# Patient Record
Sex: Female | Born: 1980 | Race: White | Hispanic: No | Marital: Single | Smoking: Former smoker
Health system: Southern US, Community
[De-identification: ages and names within clinical notes are randomized; demographics above are authoritative.]

## PROBLEM LIST (undated history)

## (undated) DIAGNOSIS — B9689 Other specified bacterial agents as the cause of diseases classified elsewhere: Secondary | ICD-10-CM

## (undated) DIAGNOSIS — G43109 Migraine with aura, not intractable, without status migrainosus: Secondary | ICD-10-CM

## (undated) DIAGNOSIS — IMO0001 Reserved for inherently not codable concepts without codable children: Secondary | ICD-10-CM

## (undated) DIAGNOSIS — R569 Unspecified convulsions: Secondary | ICD-10-CM

## (undated) DIAGNOSIS — Z23 Encounter for immunization: Secondary | ICD-10-CM

## (undated) DIAGNOSIS — N760 Acute vaginitis: Secondary | ICD-10-CM

## (undated) DIAGNOSIS — A6000 Herpesviral infection of urogenital system, unspecified: Secondary | ICD-10-CM

## (undated) DIAGNOSIS — Z8742 Personal history of other diseases of the female genital tract: Secondary | ICD-10-CM

## (undated) DIAGNOSIS — Z9289 Personal history of other medical treatment: Secondary | ICD-10-CM

## (undated) HISTORY — DX: Other specified bacterial agents as the cause of diseases classified elsewhere: N76.0

## (undated) HISTORY — DX: Reserved for inherently not codable concepts without codable children: IMO0001

## (undated) HISTORY — DX: Other specified bacterial agents as the cause of diseases classified elsewhere: B96.89

## (undated) HISTORY — DX: Herpesviral infection of urogenital system, unspecified: A60.00

## (undated) HISTORY — DX: Personal history of other medical treatment: Z92.89

## (undated) HISTORY — DX: Unspecified convulsions: R56.9

## (undated) HISTORY — DX: Encounter for immunization: Z23

## (undated) HISTORY — DX: Personal history of other diseases of the female genital tract: Z87.42

## (undated) HISTORY — DX: Migraine with aura, not intractable, without status migrainosus: G43.109

---
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
## 2006-07-13 DIAGNOSIS — Z23 Encounter for immunization: Secondary | ICD-10-CM

## 2006-07-13 HISTORY — DX: Encounter for immunization: Z23

## 2008-01-14 ENCOUNTER — Emergency Department: Payer: Self-pay | Admitting: Emergency Medicine

## 2008-01-14 ENCOUNTER — Other Ambulatory Visit: Payer: Self-pay

## 2008-07-13 HISTORY — PX: BREAST ENHANCEMENT SURGERY: SHX7

## 2011-04-24 DIAGNOSIS — Z8742 Personal history of other diseases of the female genital tract: Secondary | ICD-10-CM

## 2011-04-24 HISTORY — DX: Personal history of other diseases of the female genital tract: Z87.42

## 2011-07-18 ENCOUNTER — Emergency Department: Payer: Self-pay | Admitting: Emergency Medicine

## 2011-07-18 LAB — CBC
HGB: 14.3 g/dL (ref 12.0–16.0)
MCH: 30.3 pg (ref 26.0–34.0)
MCHC: 33.2 g/dL (ref 32.0–36.0)
MCV: 91 fL (ref 80–100)
RBC: 4.72 10*6/uL (ref 3.80–5.20)

## 2011-07-18 LAB — URINALYSIS, COMPLETE
Bilirubin,UR: NEGATIVE
Blood: NEGATIVE
Ketone: NEGATIVE
Ph: 8 (ref 4.5–8.0)
Protein: NEGATIVE
RBC,UR: 2 /HPF (ref 0–5)
Squamous Epithelial: <1
WBC UR: 2 /HPF (ref 0–5)

## 2011-07-18 LAB — COMPREHENSIVE METABOLIC PANEL
Albumin: 4.2 g/dL (ref 3.4–5.0)
Alkaline Phosphatase: 50 U/L (ref 50–136)
BUN: 14 mg/dL (ref 7–18)
Bilirubin,Total: 0.9 mg/dL (ref 0.2–1.0)
Co2: 25 mmol/L (ref 21–32)
Creatinine: 0.88 mg/dL (ref 0.60–1.30)
EGFR (African American): >60
Glucose: 113 mg/dL — ABNORMAL HIGH (ref 65–99)
Osmolality: 288 (ref 275–301)
SGPT (ALT): 18 U/L
Sodium: 144 mmol/L (ref 136–145)
Total Protein: 7.7 g/dL (ref 6.4–8.2)

## 2011-07-18 LAB — PREGNANCY, URINE: Pregnancy Test, Urine: NEGATIVE m[IU]/mL

## 2011-07-18 LAB — RAPID INFLUENZA A&B ANTIGENS

## 2011-07-18 LAB — LIPASE, BLOOD: Lipase: 81 U/L (ref 73–393)

## 2013-03-08 ENCOUNTER — Ambulatory Visit: Payer: Self-pay | Admitting: Neurology

## 2013-03-08 LAB — HCG, QUANTITATIVE, PREGNANCY: Beta Hcg, Quant.: <1 m[IU]/mL — ABNORMAL LOW

## 2014-12-28 HISTORY — PX: OTHER SURGICAL HISTORY: SHX169

## 2015-07-04 ENCOUNTER — Other Ambulatory Visit: Payer: Self-pay | Admitting: Neurology

## 2015-07-04 DIAGNOSIS — R55 Syncope and collapse: Secondary | ICD-10-CM

## 2015-07-04 DIAGNOSIS — M542 Cervicalgia: Secondary | ICD-10-CM

## 2015-07-30 ENCOUNTER — Ambulatory Visit: Payer: Self-pay

## 2015-08-26 ENCOUNTER — Ambulatory Visit (HOSPITAL_COMMUNITY)
Admission: RE | Admit: 2015-08-26 | Discharge: 2015-08-26 | Disposition: A | Discharge status: Home / Self Care | Payer: 59 | Source: Ambulatory Visit | Attending: Neurology | Admitting: Neurology

## 2015-08-26 DIAGNOSIS — R569 Unspecified convulsions: Secondary | ICD-10-CM | POA: Insufficient documentation

## 2015-08-26 DIAGNOSIS — M542 Cervicalgia: Secondary | ICD-10-CM

## 2015-08-26 DIAGNOSIS — R51 Headache: Secondary | ICD-10-CM | POA: Insufficient documentation

## 2015-08-26 DIAGNOSIS — R55 Syncope and collapse: Secondary | ICD-10-CM | POA: Insufficient documentation

## 2017-03-01 NOTE — Progress Notes (Signed)
Gynecology Annual Exam  PCP: Lonell Face, MD  Chief Complaint:  Chief Complaint  Patient presents with  . Gynecologic Exam    History of Present Illness:Penny Hamilton presents today for her annual exam. She is a 36 year old Caucasian/White female, G 0 P 0 0 0 0, who has no gyn complaints. Her menses are absent due to the Nexplanon.. She denies dysmenorrhea.   The patient's past medical history is remarkable for seizures and migraine headaches. She reports that her last seizure was 2 years ago and her headaches are usually a few times a month. Both her seizures and headaches have decreased in frequency since her Nexplanon was reinserted 12/28/2014. She also has a history of HSV II and she would like a refill of her Valtrex.  Since her last annual GYN exam dated 12/24/2014, she has had no other significant changes in her health history.   She is not sexually active. She has been sexually active in the past but not currently. She denies any history of STDs.  Her most recent pap smear was obtained 11/24/2013 and was negative cells with negative HPV DNA. History of LGSIL in 2013 Mammogram is not applicable. There is no family history of breast cancer. There is no family history of ovarian cancer. The patient does do occ  self breast exams. Has felt some tenderness in her inner upper right breast in the last couple months and a ?mass.She has breast implants. The patient is smoking a few cigs/day. Pt is unable to take Chantix d/t seizures and nicotine patches give her heart palpitations. Pt has not tried Wellbutrin.  The patient does not drink alcohol.  The patient does not use illegal drugs.  The patient has not ben exercising due to overtime at work. She has a BMI of 23.59 kg/m2.  The patient does get adequate calcium in her diet.  She has had a recent cholesterol screen 2016 and it was normal. Pt does not have a PCP but many specialists.   The patient denies current symptoms of  depression.    Review of Systems: Review of Systems  Constitutional: Negative for chills, fever and weight loss.  HENT: Negative for congestion, sinus pain and sore throat.   Eyes: Negative for blurred vision and pain.  Respiratory: Negative for hemoptysis, shortness of breath and wheezing.   Cardiovascular: Negative for chest pain, palpitations and leg swelling.  Gastrointestinal: Negative for abdominal pain, blood in stool, diarrhea, heartburn, nausea and vomiting.  Genitourinary: Negative for dysuria, frequency, hematuria and urgency.       Positive for amenorrhea  Musculoskeletal: Negative for back pain, joint pain and myalgias.  Skin: Negative for itching and rash.  Neurological: Negative for dizziness, tingling and headaches.  Endo/Heme/Allergies: Negative for environmental allergies and polydipsia. Does not bruise/bleed easily.       Negative for hirsutism   Psychiatric/Behavioral: Negative for depression. The patient is nervous/anxious. The patient does not have insomnia.     Past Medical History:  Past Medical History:  Diagnosis Date  . Genital herpes   . History of abnormal cervical Pap smear 04/24/2011   LGSIL, normal paps 2013/2015  . Human papilloma virus (HPV) type 9 vaccine administered    gardisil completed  . Migraine with aura   . Seizures (HCC)     Past Surgical History:  Past Surgical History:  Procedure Laterality Date  . BREAST ENHANCEMENT SURGERY Bilateral 2010  . nexplanon insertion  12/28/2014    Family  History:  Family History  Problem Relation Age of Onset  . Colon polyps Mother   . Polycystic ovary syndrome Mother   . Melanoma Brother 36       on back  . Stroke Maternal Grandmother   . Heart attack Paternal Grandfather   . Melanoma Paternal Aunt   . Diabetes Neg Hx     Social History:  Social History   Social History  . Marital status: Single    Spouse name: N/A  . Number of children: 0  . Years of education: 23   Occupational  History  . 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 Smoking status: Current Every Day Smoker    Types: Cigarettes  . Smokeless tobacco: Never Used     Comment: 1 pack per week  . Alcohol use No  . Drug use: No  . Sexual activity: Not Currently    Birth control/ protection: Implant   Other Topics Concern  . Not on file   Social History Narrative  . No narrative on file    Allergies:  Allergies  Allergen Reactions  . Topiramate Nausea Only  . Lamotrigine Rash    Medications: Current Outpatient Prescriptions:  .  aspirin-acetaminophen-caffeine (EXCEDRIN MIGRAINE) 250-250-65 MG tablet, Take by mouth., Disp: , Rfl:  .  Cholecalciferol (VITAMIN D-1000 MAX ST) 1000 units tablet, Take by mouth., Disp: , Rfl:  .  etonogestrel (NEXPLANON) 68 MG IMPL implant, 1 each by Subdermal route once., Disp: , Rfl:  .  valACYclovir (VALTREX) 500 MG tablet, Take one BID x 3-5 days prn outbreak, Disp: 30 tablet, Rfl: 2 Physical Exam Vitals: BP 102/62   Pulse 79   Ht 5\' 2"  (1.575 m)   Wt 129 lb (58.5 kg)   BMI 23.59 kg/m   General: WF in NAD HEENT: normocephalic, anicteric Neck: no thyroid enlargement, no palpable nodules, no cervical lymphadenopathy  Pulmonary: No increased work of breathing, CTAB Cardiovascular: RRR, without murmur  Breast: Breast implants present,  no tenderness, no palpable nodules or masses, no skin or nipple retraction present, no nipple discharge.  No axillary, infraclavicular or supraclavicular lymphadenopathy. Abdomen: Soft, non-tender, non-distended.  Umbilicus without lesions.  No hepatomegaly or masses palpable. No evidence of hernia. Genitourinary:  External: vesicular lesions on inflamed base on left lower labia majora.  Normal urethral meatus, normal  Bartholin's and Skene's glands.    Vagina: Normal vaginal mucosa, no evidence of prolapse.    Cervix: Grossly normal in appearance, no bleeding, non-tender  Uterus: Anteverted, normal size, shape,  and consistency, mobile, and non-tender  Adnexa: No adnexal masses, non-tender  Rectal: deferred  Lymphatic: no evidence of inguinal lymphadenopathy Extremities: no edema, erythema, or tenderness Neurologic: Grossly intact Psychiatric: mood appropriate, affect full     Assessment: 36 y.o. G0P0000 for annual gyn exm Tobacco use Herpes genitalis  Plan:  1) Breast cancer screening - recommend monthly self breast exam.   2) STI screening was offered and declined.  3) Cervical cancer screening - Pap was done. ASCCP guidelines and rational discussed.  Patient opts for yearly screening interval  4) Contraception - Nexplanon will expire June 2019. Will probably have it replaced  5) Routine healthcare maintenance including cholesterol and diabetes screening UTD   6) Discussed smoking cessation and given smoking cessation hot line #.   7) Refill of Valtrex sent to pharmacy  Farrel Conners, CNM

## 2017-03-02 ENCOUNTER — Ambulatory Visit (INDEPENDENT_AMBULATORY_CARE_PROVIDER_SITE_OTHER): Payer: 59 | Admitting: Certified Nurse Midwife

## 2017-03-02 ENCOUNTER — Encounter: Payer: Self-pay | Admitting: Certified Nurse Midwife

## 2017-03-02 VITALS — BP 102/62 | HR 79 | Ht 62.0 in | Wt 129.0 lb

## 2017-03-02 DIAGNOSIS — Z01419 Encounter for gynecological examination (general) (routine) without abnormal findings: Secondary | ICD-10-CM | POA: Diagnosis not present

## 2017-03-02 DIAGNOSIS — R569 Unspecified convulsions: Secondary | ICD-10-CM | POA: Insufficient documentation

## 2017-03-02 DIAGNOSIS — A6000 Herpesviral infection of urogenital system, unspecified: Secondary | ICD-10-CM | POA: Insufficient documentation

## 2017-03-02 DIAGNOSIS — Z124 Encounter for screening for malignant neoplasm of cervix: Secondary | ICD-10-CM

## 2017-03-02 DIAGNOSIS — G43109 Migraine with aura, not intractable, without status migrainosus: Secondary | ICD-10-CM | POA: Insufficient documentation

## 2017-03-02 MED ORDER — VALACYCLOVIR HCL 500 MG PO TABS: ORAL_TABLET | ORAL | 2 refills | Status: DC

## 2017-03-02 NOTE — Patient Instructions (Signed)
Steps to Quit Smoking Smoking tobacco can be bad for your health. It can also affect almost every organ in your body. Smoking puts you and people around you at risk for many serious long-lasting (chronic) diseases. Quitting smoking is hard, but it is one of the best things that you can do for your health. It is never too late to quit. What are the benefits of quitting smoking? When you quit smoking, you lower your risk for getting serious diseases and conditions. They can include:  Lung cancer or lung disease.  Heart disease.  Stroke.  Heart attack.  Not being able to have children (infertility).  Weak bones (osteoporosis) and broken bones (fractures).  If you have coughing, wheezing, and shortness of breath, those symptoms may get better when you quit. You may also get sick less often. If you are pregnant, quitting smoking can help to lower your chances of having a baby of low birth weight. What can I do to help me quit smoking? Talk with your doctor about what can help you quit smoking. Some things you can do (strategies) include:  Quitting smoking totally, instead of slowly cutting back how much you smoke over a period of time.  Going to in-person counseling. You are more likely to quit if you go to many counseling sessions.  Using resources and support systems, such as: ? Online chats with a counselor. ? Phone quitlines. ? Printed self-help materials. ? Support groups or group counseling. ? Text messaging programs. ? Mobile phone apps or applications.  Taking medicines. Some of these medicines may have nicotine in them. If you are pregnant or breastfeeding, do not take any medicines to quit smoking unless your doctor says it is okay. Talk with your doctor about counseling or other things that can help you.  Talk with your doctor about using more than one strategy at the same time, such as taking medicines while you are also going to in-person counseling. This can help make  quitting easier. What things can I do to make it easier to quit? Quitting smoking might feel very hard at first, but there is a lot that you can do to make it easier. Take these steps:  Talk to your family and friends. Ask them to support and encourage you.  Call phone quitlines, reach out to support groups, or work with a counselor.  Ask people who smoke to not smoke around you.  Avoid places that make you want (trigger) to smoke, such as: ? Bars. ? Parties. ? Smoke-break areas at work.  Spend time with people who do not smoke.  Lower the stress in your life. Stress can make you want to smoke. Try these things to help your stress: ? Getting regular exercise. ? Deep-breathing exercises. ? Yoga. ? Meditating. ? Doing a body scan. To do this, close your eyes, focus on one area of your body at a time from head to toe, and notice which parts of your body are tense. Try to relax the muscles in those areas.  Download or buy apps on your mobile phone or tablet that can help you stick to your quit plan. There are many free apps, such as QuitGuide from the CDC (Centers for Disease Control and Prevention). You can find more support from smokefree.gov and other websites.  This information is not intended to replace advice given to you by your health care provider. Make sure you discuss any questions you have with your health care provider. Document Released: 04/25/2009 Document   Revised: 02/25/2016 Document Reviewed: 11/13/2014 Elsevier Interactive Patient Education  [employer]. Quit line 1 681 017 1133 or 1-800-Quit now

## 2017-03-04 LAB — IGP, APTIMA HPV
HPV APTIMA: NEGATIVE
PAP SMEAR COMMENT: 0

## 2017-03-10 ENCOUNTER — Encounter: Payer: Self-pay | Admitting: Certified Nurse Midwife

## 2017-03-10 DIAGNOSIS — A6000 Herpesviral infection of urogenital system, unspecified: Secondary | ICD-10-CM | POA: Insufficient documentation

## 2018-01-03 ENCOUNTER — Telehealth: Payer: Self-pay | Admitting: Obstetrics and Gynecology

## 2018-01-03 NOTE — Telephone Encounter (Signed)
Patient is schedule for nexplanon removal and reinsertion with ABC Wednesday 01/05/18 at 9:50 . Annual schedule with CLG on 03/07/18

## 2018-01-04 NOTE — Telephone Encounter (Signed)
Nexplanon reserved for this patient. 

## 2018-01-05 ENCOUNTER — Ambulatory Visit (INDEPENDENT_AMBULATORY_CARE_PROVIDER_SITE_OTHER): Payer: Managed Care, Other (non HMO) | Admitting: Obstetrics and Gynecology

## 2018-01-05 ENCOUNTER — Encounter: Payer: Self-pay | Admitting: Obstetrics and Gynecology

## 2018-01-05 VITALS — BP 100/60 | Ht 62.0 in | Wt 130.0 lb

## 2018-01-05 DIAGNOSIS — Z3049 Encounter for surveillance of other contraceptives: Secondary | ICD-10-CM

## 2018-01-05 DIAGNOSIS — Z3046 Encounter for surveillance of implantable subdermal contraceptive: Secondary | ICD-10-CM

## 2018-01-05 DIAGNOSIS — A6004 Herpesviral vulvovaginitis: Secondary | ICD-10-CM

## 2018-01-05 DIAGNOSIS — Z30017 Encounter for initial prescription of implantable subdermal contraceptive: Secondary | ICD-10-CM

## 2018-01-05 MED ORDER — VALACYCLOVIR HCL 500 MG PO TABS: ORAL_TABLET | ORAL | 0 refills | Status: DC

## 2018-01-05 MED ORDER — ETONOGESTREL 68 MG ~~LOC~~ IMPL: 1.0000 | DRUG_IMPLANT | Freq: Once | SUBCUTANEOUS | 0 refills | Status: DC

## 2018-01-05 NOTE — Progress Notes (Signed)
   Chief Complaint  Patient presents with  . Contraception     History of Present Illness:  Penny Hamilton is a 37 y.o. that had a nexplanon placed approximately 3 years  ago. Since that time, she has done well and would like a replacement. Is not current sex active. Annual due 8/19.  Nexplanon removal Procedure note - The Nexplanon was noted in the patient's arm and the end was identified. The skin was cleansed with a Betadine solution. A small injection of subcutaneous lidocaine with epinephrine was given over the end of the implant. An incision was made at the end of the implant. The rod was noted in the incision and grasped with a hemostat. It was noted to be intact.  Steri-Strip was placed approximating the incision. Hemostasis was noted.  Nexplanon Insertion  Patient given informed consent, signed copy in the chart, time out was performed.  Appropriate time out taken.  [name] LEFT arm was prepped and draped in the usual sterile fashion. The ruler used to measure and mark insertion area.  Pt was prepped with betadine swab and then injected with 1.0 cc of 2% lidocaine with epinephrine. Nexplanon removed form packaging,  Device confirmed in needle, then inserted full length of needle and withdrawn per handbook instructions.  Pt insertion site covered with steri-strip and a bandage.   Minimal blood loss.  Pt tolerated the procedure welL.  Assessment: Nexplanon removal  Nexplanon insertion - Plan: etonogestrel (NEXPLANON) 68 MG IMPL implant  Herpes simplex vulvovaginitis - Rx RF valtrex prn sx.   Meds ordered this encounter  Medications  . valACYclovir (VALTREX) 500 MG tablet    Sig: Take one BID x 3-5 days prn outbreak    Dispense:  30 tablet    Refill:  0    Order Specific Question:   Supervising Provider    Answer:   Penny MustardHARRIS, Penny P [id number]]  . etonogestrel (NEXPLANON) 68 MG IMPL implant    Sig: 1 each (68 mg total) by Subdermal route once for 1 dose.    Dispense:  1 each    Refill:  0    Order Specific Question:   Supervising Provider    Answer:   Penny MustardHARRIS, Penny P [[id number]]     Plan:   She was told to remove the dressing in 24 hours, to keep the incision area dry for 24 hours and to remove the Steristrip in 2-3  days.  Notify us if any signs of tenderness, redness, pain, or fevers develop.   Penny Hamilton B. Penny Hakeem, PA-C 01/05/2018 10:51 AM

## 2018-01-05 NOTE — Patient Instructions (Signed)
I value your feedback and entrusting us with your care. If you get a [facility] patient survey, I would appreciate you taking the time to let us know about your experience today. Thank you!  Remove the dressing in 24 hours,  keep the incision area dry for 24 hours and remove the Steristrip in 2-3  days.  Notify us if any signs of tenderness, redness, pain, or fevers develop.  

## 2018-02-01 NOTE — Telephone Encounter (Signed)
Nexplanon rcvd/charged 01/05/18

## 2018-03-06 NOTE — Progress Notes (Signed)
Gynecology Annual Exam  PCP: Patient, No Pcp Per  Chief Complaint:  Chief Complaint  Patient presents with  . Gynecologic Exam    No complaints    History of Present Illness:Penny Hamilton presents today for her annual exam. She is a 37 year old Caucasian/White female, G 0 P 0 0 0 0, who has no gyn complaints. Her menses are absent due to the Nexplanon.. She denies dysmenorrhea.   The patient's past medical history is remarkable for seizures and migraine headaches. She reports that her last seizure was 3 years ago and her headaches are usually 4-5 times a month. Both her seizures and headaches have decreased in frequency since she had a Nexplanon inserted in 2016. She also has a history of HSV II and she would like a refill of her Valtrex. She has had 2 outbreaks this past year.  Since her last annual GYN exam dated 03/02/2017, she has quit smoking!  She also had her Nexplanon replaced 01/05/2018. She is not sexually active. She has been sexually active in the past but not currently.   Her most recent pap smear was obtained 03/02/2017 and was negative cells with negative HPV DNA. History of LGSIL in 2013 Mammogram is not applicable. There is no family history of breast cancer. There is no family history of ovarian cancer. The patient does does not do self breast exams. She has breast implants. The patient is a former smoker  The patient does not drink alcohol.  The patient does not use illegal drugs.  The patient has not been exercising 4x/week until this last month when she had a shift change.. She has a BMI of 23.78 kg/m2.  The patient does get adequate calcium in her diet.  She has had a recent cholesterol screen 2019 at work and it was normal.    The patient denies current symptoms of depression.    Review of Systems: Review of Systems  Constitutional: Negative for chills, fever and weight loss.  HENT: Positive for sore throat (wakes with some burning in her throat). Negative for  congestion and sinus pain.   Eyes: Negative for blurred vision and pain.  Respiratory: Negative for hemoptysis, shortness of breath and wheezing.   Cardiovascular: Negative for chest pain, palpitations and leg swelling.  Gastrointestinal: Negative for abdominal pain, blood in stool, diarrhea, heartburn, nausea and vomiting.  Genitourinary: Negative for dysuria, frequency, hematuria and urgency.       Positive for amenorrhea  Musculoskeletal: Negative for back pain, joint pain and myalgias.  Skin: Negative for itching and rash.  Neurological: Positive for headaches. Negative for dizziness and tingling.  Endo/Heme/Allergies: Negative for environmental allergies and polydipsia. Does not bruise/bleed easily.       Negative for hirsutism   Psychiatric/Behavioral: Negative for depression. The patient is not nervous/anxious and does not have insomnia.     Past Medical History:  Past Medical History:  Diagnosis Date  . Bacterial vaginosis    recurrent; tx c metroget prn c good relief  . Genital herpes   . History of abnormal cervical Pap smear 04/24/2011   LGSIL, normal paps since 2013/2015  . History of Papanicolaou smear of cervix 11/23/11; 06/22/12;11/24/13   -/-, ct/gc neg; neg; -/-, ct/gc neg  . Human papilloma virus (HPV) type 9 vaccine administered    gardisil completed  . Immunization, viral disease 2008   gardasil series completed  . Migraine with aura   . Seizures (HCC)     Past  Surgical History:  Past Surgical History:  Procedure Laterality Date  . BREAST ENHANCEMENT SURGERY Bilateral 2010  . nexplanon insertion  12/28/2014    Family History:  Family History  Problem Relation Age of Onset  . Colon polyps Mother   . Polycystic ovary syndrome Mother   . Melanoma Brother 36       on back  . Stroke Maternal Grandmother   . Heart attack Paternal Grandfather   . Melanoma Paternal Aunt   . Diabetes Neg Hx     Social History:  Social History   Socioeconomic History    . Marital status: Single    Spouse name: Not on file  . Number of children: 0  . Years of education: 88  . Highest education level: Not on file  Occupational History  . Occupation: Designer, industrial/product in [employer]  Social Needs  . Financial resource strain: Not on file  . Food insecurity:    Worry: Not on file    Inability: Not on file  . Transportation needs:    Medical: Not on file    Non-medical: Not on file  Tobacco Use  . Smoking status: Former Smoker    Types: Cigarettes    Last attempt to quit: 04/05/2017    Years since quitting: 0.9  . Smokeless tobacco: Never Used  . Tobacco comment: 1 pack per week  Substance and Sexual Activity  . Alcohol use: Yes    Comment: rare  . Drug use: No  . Sexual activity: Not Currently    Partners: Male    Birth control/protection: Implant  Lifestyle  . Physical activity:    Days per week: 4 days    Minutes per session: Not on file  . Stress: Not on file  Relationships  . Social connections:    Talks on phone: Not on file    Gets together: Not on file    Attends religious service: Not on file    Active member of club or organization: Not on file    Attends meetings of clubs or organizations: Not on file    Relationship status: Not on file  . Intimate partner violence:    Fear of current or ex partner: Not on file    Emotionally abused: Not on file    Physically abused: Not on file    Forced sexual activity: Not on file  Other Topics Concern  . Not on file  Social History Narrative  . Not on file    Allergies:  Allergies  Allergen Reactions  . Topiramate Nausea Only  . Lamotrigine Rash    Medications: Current Outpatient Medications:  .  aspirin-acetaminophen-caffeine (EXCEDRIN MIGRAINE) 250-250-65 MG tablet, Take by mouth., Disp: , Rfl:  .  Cholecalciferol (VITAMIN D-1000 MAX ST) 1000 units tablet, Take by mouth., Disp: , Rfl:  .  valACYclovir (VALTREX) 500 MG tablet, Take one BID x 3-5 days prn outbreak, Disp: 30 tablet, Rfl:  1 .  etonogestrel (NEXPLANON) 68 MG IMPL implant, 1 each (68 mg total) by Subdermal route once for 1 dose., Disp: 1 each, Rfl: 0 Physical Exam Vitals: BP 104/60 (BP Location: Right Arm, Patient Position: Sitting, Cuff Size: Normal)   Pulse 85   Wt 130 lb (59 kg)   BMI 23.78 kg/m   General: WF in NAD HEENT: normocephalic, anicteric Neck: no thyroid enlargement, no palpable nodules, no cervical lymphadenopathy  Pulmonary: No increased work of breathing, CTAB Cardiovascular: RRR, without murmur  Breast: Breast implants present,  no tenderness,  no palpable nodules or masses, no skin or nipple retraction present, no nipple discharge.  No axillary, infraclavicular or supraclavicular lymphadenopathy. Abdomen: Soft, non-tender, non-distended.  Umbilicus without lesions.  No hepatomegaly or masses palpable. No evidence of hernia. Genitourinary:  External: No lesions or inflammation.  Normal urethral meatus, normal Bartholin's and Skene's glands.    Vagina: Normal vaginal mucosa, no evidence of prolapse.    Cervix: Grossly normal in appearance, no bleeding, non-tender  Uterus: Anteverted, normal size, shape, and consistency, mobile, and non-tender  Adnexa: No adnexal masses, non-tender  Rectal: deferred  Lymphatic: no evidence of inguinal lymphadenopathy Extremities: no edema, erythema, or tenderness Neurologic: Grossly intact Psychiatric: mood appropriate, affect full     Assessment: 37 y.o. G0P0000 for annual gyn exm History of herpes genitalis Amenorrhea on Nexplanon.  Plan:  1) Breast cancer screening - recommend monthly self breast exam.   2) STI screening was offered and declined.  3) Cervical cancer screening - Pap was done. ASCCP guidelines and rational discussed.  Patient opts for yearly screening interval  4) Contraception - Nexplanon replaced this year-expires in 12/2020  5) Routine healthcare maintenance including cholesterol and diabetes screening UTD   6) Refill of  Valtrex sent to pharmacy  7) RTO 1 year and prn.  Farrel Conners, CNM

## 2018-03-07 ENCOUNTER — Other Ambulatory Visit: Payer: Self-pay

## 2018-03-07 ENCOUNTER — Encounter: Payer: Self-pay | Admitting: Certified Nurse Midwife

## 2018-03-07 ENCOUNTER — Ambulatory Visit (INDEPENDENT_AMBULATORY_CARE_PROVIDER_SITE_OTHER): Payer: Managed Care, Other (non HMO) | Admitting: Certified Nurse Midwife

## 2018-03-07 VITALS — BP 104/60 | HR 85 | Ht 62.0 in | Wt 130.0 lb

## 2018-03-07 DIAGNOSIS — Z124 Encounter for screening for malignant neoplasm of cervix: Secondary | ICD-10-CM

## 2018-03-07 DIAGNOSIS — Z01419 Encounter for gynecological examination (general) (routine) without abnormal findings: Secondary | ICD-10-CM | POA: Diagnosis not present

## 2018-03-07 MED ORDER — VALACYCLOVIR HCL 500 MG PO TABS: ORAL_TABLET | ORAL | 1 refills | Status: DC

## 2018-03-10 LAB — IGP,RFX APTIMA HPV ALL PTH: PAP SMEAR COMMENT: 0

## 2018-03-11 ENCOUNTER — Encounter: Payer: Self-pay | Admitting: Certified Nurse Midwife

## 2019-04-26 ENCOUNTER — Other Ambulatory Visit: Payer: Self-pay | Admitting: Certified Nurse Midwife

## 2019-12-18 ENCOUNTER — Other Ambulatory Visit: Payer: Self-pay | Admitting: Certified Nurse Midwife

## 2020-01-24 ENCOUNTER — Ambulatory Visit: Payer: Managed Care, Other (non HMO) | Admitting: Podiatry

## 2020-01-24 ENCOUNTER — Other Ambulatory Visit: Payer: Self-pay

## 2020-01-24 ENCOUNTER — Encounter: Payer: Self-pay | Admitting: Podiatry

## 2020-01-24 ENCOUNTER — Ambulatory Visit (INDEPENDENT_AMBULATORY_CARE_PROVIDER_SITE_OTHER): Payer: Managed Care, Other (non HMO)

## 2020-01-24 DIAGNOSIS — M79672 Pain in left foot: Secondary | ICD-10-CM

## 2020-01-24 DIAGNOSIS — M722 Plantar fascial fibromatosis: Secondary | ICD-10-CM | POA: Diagnosis not present

## 2020-01-24 DIAGNOSIS — M79671 Pain in right foot: Secondary | ICD-10-CM

## 2020-01-24 DIAGNOSIS — M2580 Other specified joint disorders, unspecified joint: Secondary | ICD-10-CM

## 2020-01-24 DIAGNOSIS — M659 Synovitis and tenosynovitis, unspecified: Secondary | ICD-10-CM | POA: Diagnosis not present

## 2020-01-24 MED ORDER — MELOXICAM 15 MG PO TABS: 15.0000 mg | ORAL_TABLET | Freq: Every day | ORAL | 1 refills | Status: DC

## 2020-01-24 MED ORDER — METHYLPREDNISOLONE 4 MG PO TBPK: ORAL_TABLET | ORAL | 0 refills | Status: DC

## 2020-01-26 NOTE — Progress Notes (Signed)
   HPI: 39 y.o. female presenting today as a new patient for evaluation of left foot and ankle pain.  Patient also has pain to the right forefoot.  Patient has been dealing with this pain off and on for the past 1-2 years.  Sudden onset.  He denies injury to the area.  He states that he experiences dull aches throughout the arch and heel which can occasionally become very sharp and intense.  Aggravated with standing and walking.  He has tried OTC insoles with minimal relief.  He presents for further treatment and evaluation  Past Medical History:  Diagnosis Date  . Bacterial vaginosis    recurrent; tx c metroget prn c good relief  . Genital herpes   . History of abnormal cervical Pap smear 04/24/2011   LGSIL, normal paps since 2013/2015  . History of Papanicolaou smear of cervix 11/23/11; 06/22/12;11/24/13   -/-, ct/gc neg; neg; -/-, ct/gc neg  . Human papilloma virus (HPV) type 9 vaccine administered    gardisil completed  . Immunization, viral disease 2008   gardasil series completed  . Migraine with aura   . Seizures [facility])      Physical Exam: General: The patient is alert and oriented x3 in no acute distress.  Dermatology: Skin is warm, dry and supple bilateral lower extremities. Negative for open lesions or macerations.  Vascular: Palpable pedal pulses bilaterally. No edema or erythema noted. Capillary refill within normal limits.  Neurological: Epicritic and protective threshold grossly intact bilaterally.   Musculoskeletal Exam: Range of motion within normal limits to all pedal and ankle joints bilateral. Muscle strength 5/5 in all groups bilateral.  Pain on palpation noted to the sesamoidal apparatus of the bilateral great toes first MTPJ.  There is also pain on palpation to the medial calcaneal tubercle at the insertion of the plantar fascia medial band the left.  There is also pain on palpation and range of motion to the left ankle joint both anterior medial and lateral  aspects.  Radiographic Exam:  Normal osseous mineralization. Joint spaces preserved. No fracture/dislocation/boney destruction.    Assessment: 1.  Sesamoiditis bilateral 2.  Plantar fasciitis left 3.  Synovitis of left ankle   Plan of Care:  1. Patient evaluated. X-Rays reviewed.  2.  Prescription for Medrol Dosepak 3.  Prescription for meloxicam 15 mg daily to begin after completion of the Dosepak 4.  Appointment with Pedorthist for custom molded insoles 5.  Recommend good supportive shoes at [employer] running store 6.  Return to clinic in 8 weeks  *Works at [employer], DPM [facility]  Dr. Felecia Shelling, DPM    [address] [place], [place] [address]                Office [phone]  Fax [phone]

## 2020-02-05 ENCOUNTER — Other Ambulatory Visit: Payer: Self-pay

## 2020-02-05 ENCOUNTER — Ambulatory Visit (INDEPENDENT_AMBULATORY_CARE_PROVIDER_SITE_OTHER): Payer: Managed Care, Other (non HMO) | Admitting: Orthotics

## 2020-02-05 DIAGNOSIS — M722 Plantar fascial fibromatosis: Secondary | ICD-10-CM

## 2020-02-05 DIAGNOSIS — M2580 Other specified joint disorders, unspecified joint: Secondary | ICD-10-CM

## 2020-02-05 NOTE — Progress Notes (Signed)

## 2020-02-27 ENCOUNTER — Ambulatory Visit: Payer: Managed Care, Other (non HMO) | Admitting: Orthotics

## 2020-02-27 ENCOUNTER — Other Ambulatory Visit: Payer: Self-pay

## 2020-02-27 DIAGNOSIS — M2580 Other specified joint disorders, unspecified joint: Secondary | ICD-10-CM

## 2020-02-27 DIAGNOSIS — M722 Plantar fascial fibromatosis: Secondary | ICD-10-CM

## 2020-02-27 NOTE — Progress Notes (Signed)
Patient came in today to pick up custom made foot orthotics.  The goals were accomplished and the patient reported no dissatisfaction with said orthotics.  Patient was advised of breakin period and how to report any issues. 

## 2020-03-14 ENCOUNTER — Ambulatory Visit: Payer: Managed Care, Other (non HMO) | Admitting: Obstetrics and Gynecology

## 2020-03-20 ENCOUNTER — Ambulatory Visit: Payer: Managed Care, Other (non HMO) | Admitting: Podiatry

## 2020-03-20 ENCOUNTER — Other Ambulatory Visit: Payer: Self-pay

## 2020-03-20 DIAGNOSIS — M2580 Other specified joint disorders, unspecified joint: Secondary | ICD-10-CM

## 2020-03-20 DIAGNOSIS — M722 Plantar fascial fibromatosis: Secondary | ICD-10-CM

## 2020-03-20 DIAGNOSIS — M659 Synovitis and tenosynovitis, unspecified: Secondary | ICD-10-CM

## 2020-03-20 MED ORDER — MELOXICAM 15 MG PO TABS: 15.0000 mg | ORAL_TABLET | Freq: Every day | ORAL | 1 refills | Status: DC

## 2020-03-20 NOTE — Progress Notes (Signed)
° °  Subjective: 39 y.o. female presenting today for follow-up evaluation of bilateral foot pain.  Last visit on 01/24/2020 custom molded orthotics were recommended and prescribed.  Today she presents wearing her custom molded orthotics.  Overall she states that she is doing much better.  She states that the prednisone pack and meloxicam helped significantly.  She presents for further treatment and evaluation   Past Medical History:  Diagnosis Date   Bacterial vaginosis    recurrent; tx c metroget prn c good relief   Genital herpes    History of abnormal cervical Pap smear 04/24/2011   LGSIL, normal paps since 2013/2015   History of Papanicolaou smear of cervix 11/23/11; 06/22/12;11/24/13   -/-, ct/gc neg; neg; -/-, ct/gc neg   Human papilloma virus (HPV) type 9 vaccine administered    gardisil completed   Immunization, viral disease 2008   gardasil series completed   Migraine with aura    Seizures (HCC)      Objective: Physical Exam General: The patient is alert and oriented x3 in no acute distress.  Dermatology: Skin is warm, dry and supple bilateral lower extremities. Negative for open lesions or macerations bilateral.   Vascular: Dorsalis Pedis and Posterior Tibial pulses palpable bilateral.  Capillary fill time is immediate to all digits.  Neurological: Epicritic and protective threshold intact bilateral.   Musculoskeletal: Tenderness to palpation to the plantar aspect of the left heel along the plantar fascia. All other joints range of motion within normal limits bilateral. Strength 5/5 in all groups bilateral.  Today there is negative pain on palpation throughout the left ankle joint as well as the bilateral sesamoidal apparatus   Assessment: 1. Plantar fasciitis left foot  Plan of Care:  1. Patient evaluated 2. Injection of 0.5cc Celestone soluspan injected into the left plantar fascia.  3.  Refill prescription for Meloxicam 15 mg daily 4.  Instructed patient  regarding therapies and modalities at home to alleviate symptoms.  5.  Continue wearing custom molded orthotics  6.  Return to clinic in 8 weeks.    *Works at [employer].  Currently remodeling her popcorn ceiling in her bathroom  Felecia Shelling, DPM [facility]  Dr. Felecia Shelling, DPM    [address] [place], [place] [address]                Office [phone]  Fax [phone]

## 2020-03-24 NOTE — Progress Notes (Signed)
PCP:  Patient, No Pcp Per   Chief Complaint  Patient presents with  . Gynecologic Exam  . Injections    flu shot  . [employer] Employee     HPI:      [name] is a 39 y.o. G0P0000 whose LMP was No LMP recorded. Patient has had an implant., presents today for her annual examination.  Her menses are absent with nexplanon. Dysmenorrhea none. She does not have intermenstrual bleeding.  Sex activity: not sexually active. Nexplanon placed 01/05/18 Last Pap: 03/07/18  Results were: no abnormalities /neg HPV DNA 2018 Hx of STDs: LGSIL 2013; HSV taking valtrex prn  There is no FH of breast cancer. There is no FH of ovarian cancer. The patient does do self-breast exams.  Tobacco use: The patient denies current or previous tobacco use. Alcohol use: none No drug use.  Exercise: not active  She does get adequate calcium and Vitamin D in her diet.   Past Medical History:  Diagnosis Date  . Bacterial vaginosis    recurrent; tx c metroget prn c good relief  . Genital herpes   . History of abnormal cervical Pap smear 04/24/2011   LGSIL, normal paps since 2013/2015  . History of Papanicolaou smear of cervix 11/23/11; 06/22/12;11/24/13   -/-, ct/gc neg; neg; -/-, ct/gc neg  . Human papilloma virus (HPV) type 9 vaccine administered    gardisil completed  . Immunization, viral disease 2008   gardasil series completed  . Migraine with aura   . Seizures (HCC)     Past Surgical History:  Procedure Laterality Date  . BREAST ENHANCEMENT SURGERY Bilateral 2010  . nexplanon insertion  12/28/2014   replaced 01/05/2018    Family History  Problem Relation Age of Onset  . Colon polyps Mother   . Polycystic ovary syndrome Mother   . Diverticulitis Mother   . Diverticulosis Mother   . Melanoma Brother 36       on back  . Stroke Maternal Grandmother   . Diverticulitis Maternal Grandmother   . Heart attack Paternal Grandfather   . Melanoma Paternal Aunt   . Diabetes Neg Hx      Social History   Socioeconomic History  . Marital status: Single    Spouse name: Not on file  . Number of children: 0  . Years of education: 67  . Highest education level: Not on file  Occupational History  . Occupation: Designer, industrial/product in [employer]  Tobacco Use  . Smoking status: Former Smoker    Types: Cigarettes    Quit date: 04/05/2017    Years since quitting: 2.9  . Smokeless tobacco: Never Used  . Tobacco comment: 1 pack per week  Vaping Use  . Vaping Use: Never used  Substance and Sexual Activity  . Alcohol use: Yes    Comment: rare  . Drug use: No  . Sexual activity: Not Currently    Partners: Male    Birth control/protection: Implant  Other Topics Concern  . Not on file  Social History Narrative  . Not on file   Social Determinants of Health   Financial Resource Strain:   . Difficulty of Paying Living Expenses: Not on file  Food Insecurity:   . Worried About Programme researcher, broadcasting/film/video in the Last Year: Not on file  . Ran Out of Food in the Last Year: Not on file  Transportation Needs:   . Lack of Transportation (Medical): Not on file  . Lack  of Transportation (Non-Medical): Not on file  Physical Activity:   . Days of Exercise per Week: Not on file  . Minutes of Exercise per Session: Not on file  Stress:   . Feeling of Stress : Not on file  Social Connections:   . Frequency of Communication with Friends and Family: Not on file  . Frequency of Social Gatherings with Friends and Family: Not on file  . Attends Religious Services: Not on file  . Active Member of Clubs or Organizations: Not on file  . Attends Banker Meetings: Not on file  . Marital Status: Not on file  Intimate Partner Violence:   . Fear of Current or Ex-Partner: Not on file  . Emotionally Abused: Not on file  . Physically Abused: Not on file  . Sexually Abused: Not on file     Current Outpatient Medications:  .  aspirin-acetaminophen-caffeine (EXCEDRIN MIGRAINE) 250-250-65 MG  tablet, Take by mouth., Disp: , Rfl:  .  Cholecalciferol (VITAMIN D-1000 MAX ST) 1000 units tablet, Take by mouth., Disp: , Rfl:  .  valACYclovir (VALTREX) 500 MG tablet, TAKE 1 TABLET BY MOUTH TWICE DAILY FOR 3 TO 5 DAYS AS NEEDED FOR OUTBREAK, Disp: 30 tablet, Rfl: 1 .  etonogestrel (NEXPLANON) 68 MG IMPL implant, 1 each (68 mg total) by Subdermal route once for 1 dose., Disp: 1 each, Rfl: 0     ROS:  Review of Systems  Constitutional: Negative for fatigue, fever and unexpected weight change.  Respiratory: Negative for cough, shortness of breath and wheezing.   Cardiovascular: Negative for chest pain, palpitations and leg swelling.  Gastrointestinal: Negative for blood in stool, constipation, diarrhea, nausea and vomiting.  Endocrine: Negative for cold intolerance, heat intolerance and polyuria.  Genitourinary: Negative for dyspareunia, dysuria, flank pain, frequency, genital sores, hematuria, menstrual problem, pelvic pain, urgency, vaginal bleeding, vaginal discharge and vaginal pain.  Musculoskeletal: Negative for back pain, joint swelling and myalgias.  Skin: Negative for rash.  Neurological: Negative for dizziness, syncope, light-headedness, numbness and headaches.  Hematological: Negative for adenopathy.  Psychiatric/Behavioral: Negative for agitation, confusion, sleep disturbance and suicidal ideas. The patient is not nervous/anxious.   BREAST: No symptoms   Objective: BP 100/60   Ht 5\' 2"  (1.575 m)   Wt 145 lb (65.8 kg)   BMI 26.52 kg/m    Physical Exam Constitutional:      Appearance: She is well-developed.  Genitourinary:     Vulva, vagina, cervix, uterus, right adnexa and left adnexa normal.     No vulval lesion or tenderness noted.     No vaginal discharge, erythema or tenderness.     No cervical polyp.     Uterus is not enlarged or tender.     No right or left adnexal mass present.     Right adnexa not tender.     Left adnexa not tender.  Neck:      Thyroid: No thyromegaly.  Cardiovascular:     Rate and Rhythm: Normal rate and regular rhythm.     Heart sounds: Normal heart sounds. No murmur heard.   Pulmonary:     Effort: Pulmonary effort is normal.     Breath sounds: Normal breath sounds.  Chest:     Breasts:        Right: No mass, nipple discharge, skin change or tenderness.        Left: No mass, nipple discharge, skin change or tenderness.  Abdominal:     Palpations: Abdomen is soft.  Tenderness: There is no abdominal tenderness. There is no guarding.  Musculoskeletal:        General: Normal range of motion.     Cervical back: Normal range of motion.  Neurological:     General: No focal deficit present.     Mental Status: She is alert and oriented to person, place, and time.     Cranial Nerves: No cranial nerve deficit.  Skin:    General: Skin is warm and dry.  Psychiatric:        Mood and Affect: Mood normal.        Behavior: Behavior normal.        Thought Content: Thought content normal.        Judgment: Judgment normal.  Vitals reviewed.     Assessment/Plan: Encounter for annual routine gynecological examination  Cervical cancer screening - Plan: IGP, Aptima HPV,  Screening for HPV (human papillomavirus) - Plan: IGP, Aptima HPV,   History of abnormal cervical Pap smear - Plan: IGP, Aptima HPV,   Encounter for surveillance of implantable subdermal contraceptive--wants replacement 6/22            GYN counsel adequate intake of calcium and vitamin D, diet and exercise     F/U  Return in about 1 year (around 03/25/2021).  Shaquon Gropp B. Kalilah Barua, PA-C 03/25/2020 11:41 AM

## 2020-03-25 ENCOUNTER — Other Ambulatory Visit: Payer: Self-pay

## 2020-03-25 ENCOUNTER — Ambulatory Visit (INDEPENDENT_AMBULATORY_CARE_PROVIDER_SITE_OTHER): Payer: Managed Care, Other (non HMO) | Admitting: Obstetrics and Gynecology

## 2020-03-25 ENCOUNTER — Encounter: Payer: Self-pay | Admitting: Obstetrics and Gynecology

## 2020-03-25 VITALS — BP 100/60 | Ht 62.0 in | Wt 145.0 lb

## 2020-03-25 DIAGNOSIS — Z01419 Encounter for gynecological examination (general) (routine) without abnormal findings: Secondary | ICD-10-CM

## 2020-03-25 DIAGNOSIS — Z23 Encounter for immunization: Secondary | ICD-10-CM | POA: Diagnosis not present

## 2020-03-25 DIAGNOSIS — Z124 Encounter for screening for malignant neoplasm of cervix: Secondary | ICD-10-CM | POA: Diagnosis not present

## 2020-03-25 DIAGNOSIS — Z1151 Encounter for screening for human papillomavirus (HPV): Secondary | ICD-10-CM | POA: Diagnosis not present

## 2020-03-25 DIAGNOSIS — Z3046 Encounter for surveillance of implantable subdermal contraceptive: Secondary | ICD-10-CM

## 2020-03-25 DIAGNOSIS — Z8742 Personal history of other diseases of the female genital tract: Secondary | ICD-10-CM | POA: Diagnosis not present

## 2020-03-25 NOTE — Patient Instructions (Signed)
I value your feedback and entrusting us with your care. If you get a [facility] patient survey, I would appreciate you taking the time to let us know about your experience today. Thank you!  As of June 22, 2019, your lab results will be released to your MyChart immediately, before I even have a chance to see them. Please give me time to review them and contact you if there are any abnormalities. Thank you for your patience.  

## 2020-03-25 NOTE — Addendum Note (Signed)
Addended by: Donnetta Hail on: 03/25/2020 11:47 AM   Modules accepted: Orders

## 2020-03-27 LAB — IGP, APTIMA HPV: HPV Aptima: NEGATIVE

## 2020-05-15 ENCOUNTER — Ambulatory Visit: Payer: Managed Care, Other (non HMO) | Admitting: Podiatry

## 2020-12-18 ENCOUNTER — Telehealth: Payer: Self-pay

## 2020-12-18 NOTE — Telephone Encounter (Signed)
Patient is scheduled for 01/06/21 with [facility] for Nexplanon replacement and 03/31/21 with [facility] for Annual;

## 2021-01-02 NOTE — Progress Notes (Signed)
Patient, No Pcp Per (Inactive)   Chief Complaint  Patient presents with   Contraception    Nexplanon replacement   Urinary Tract Infection    Frequency and pain during urination, right lower back pain x 1 week    HPI:      [name] is a 40 y.o. G0P0000 whose LMP was No LMP recorded. Patient has had an implant., presents today for UTI symptoms and nexplanon replacement.  Pt with dysuria, urinary frequency and urgency for past wk, also with RT LBP. Feels some discomfort in umbilical area. No hematuria, fevers, vag sx. No vag bleeding. Hx of UTIs in past. Drinks a lot of sweet tea. Pt also due for nexplanon replacement. Placed 6/19. Doing well, amenorrheic, no cramping.  Has annual 9/22  Past Medical History:  Diagnosis Date   Bacterial vaginosis    recurrent; tx c metroget prn c good relief   Genital herpes    History of abnormal cervical Pap smear 04/24/2011   LGSIL, normal paps since 2013/2015   History of Papanicolaou smear of cervix 11/23/11; 06/22/12;11/24/13   -/-, ct/gc neg; neg; -/-, ct/gc neg   Human papilloma virus (HPV) type 9 vaccine administered    gardisil completed   Immunization, viral disease 2008   gardasil series completed   Migraine with aura    Seizures (HCC)     Past Surgical History:  Procedure Laterality Date   BREAST ENHANCEMENT SURGERY Bilateral 2010   nexplanon insertion  12/28/2014   replaced 01/05/2018    Family History  Problem Relation Age of Onset   Colon polyps Mother    Polycystic ovary syndrome Mother    Diverticulitis Mother    Diverticulosis Mother    Melanoma Brother 49       on back   Stroke Maternal Grandmother    Diverticulitis Maternal Grandmother    Heart attack Paternal Grandfather    Melanoma Paternal Aunt    Diabetes Neg Hx     Social History   Socioeconomic History   Marital status: Single    Spouse name: Not on file   Number of children: 0   Years of education: 16   Highest education level: Not  on file  Occupational History   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  Tobacco Use   Smoking status: Former    Pack years: 0.00    Types: Cigarettes    Quit date: 04/05/2017    Years since quitting: 3.7   Smokeless tobacco: Never   Tobacco comments:    1 pack per week  Vaping Use   Vaping Use: Never used  Substance and Sexual Activity   Alcohol use: Yes    Comment: rare   Drug use: No   Sexual activity: Not Currently    Partners: Male    Birth control/protection: Implant  Other Topics Concern   Not on file  Social History Narrative   Not on file   Social Determinants of Health   Financial Resource Strain: Not on file  Food Insecurity: Not on file  Transportation Needs: Not on file  Physical Activity: Not on file  Stress: Not on file  Social Connections: Not on file  Intimate Partner Violence: Not on file    Outpatient Medications Prior to Visit  Medication Sig Dispense Refill   aspirin-acetaminophen-caffeine (EXCEDRIN MIGRAINE) 250-250-65 MG tablet Take by mouth.     Cholecalciferol 25 MCG (1000 UT) tablet Take by mouth.  meloxicam (MOBIC) 15 MG tablet Take 15 mg by mouth daily.     valACYclovir (VALTREX) 500 MG tablet TAKE 1 TABLET BY MOUTH TWICE DAILY FOR 3 TO 5 DAYS AS NEEDED FOR OUTBREAK 30 tablet 1   etonogestrel (NEXPLANON) 68 MG IMPL implant 1 each (68 mg total) by Subdermal route once for 1 dose. 1 each 0   No facility-administered medications prior to visit.      ROS:  Review of Systems  Constitutional:  Negative for fever.  Gastrointestinal:  Negative for blood in stool, constipation, diarrhea, nausea and vomiting.  Genitourinary:  Positive for dysuria, frequency and urgency. Negative for dyspareunia, flank pain, hematuria, vaginal bleeding, vaginal discharge and vaginal pain.  Musculoskeletal:  Positive for back pain.  Skin:  Negative for rash.  BREAST: No symptoms   OBJECTIVE:   Vitals:  BP 120/70   Ht 5\' 2"  (1.575 m)   Wt 151 lb (68.5 kg)    BMI 27.62 kg/m   Physical Exam Vitals reviewed.  Constitutional:      Appearance: She is well-developed. She is not ill-appearing or toxic-appearing.  Pulmonary:     Effort: Pulmonary effort is normal.  Abdominal:     Tenderness: There is no right CVA tenderness or left CVA tenderness.  Musculoskeletal:        General: Normal range of motion.       Arms:     Cervical back: Normal range of motion.  Neurological:     General: No focal deficit present.     Mental Status: She is alert and oriented to person, place, and time.     Cranial Nerves: No cranial nerve deficit.  Psychiatric:        Behavior: Behavior normal.        Thought Content: Thought content normal.        Judgment: Judgment normal.    Results: Results for orders placed or performed in visit on 01/06/21 (from the past 24 hour(s))  POCT Urinalysis Dipstick     Status: Normal   Collection Time: 01/06/21  9:19 AM  Result Value Ref Range   Color, UA yellow    Clarity, UA clear    Glucose, UA Negative Negative   Bilirubin, UA neg    Ketones, UA neg    Spec Grav, UA 1.025 1.010 - 1.025   Blood, UA trace    pH, UA 6.0 5.0 - 8.0   Protein, UA Negative Negative   Urobilinogen, UA     Nitrite, UA neg    Leukocytes, UA Negative Negative   Appearance     Odor     Nexplanon removal Procedure note - The Nexplanon was noted in the patient's arm and the end was identified. The skin was cleansed with a Betadine solution. A small injection of subcutaneous lidocaine with epinephrine was given over the end of the implant. An incision was made at the end of the implant. The rod was noted in the incision and grasped with a hemostat. It was noted to be intact.  Steri-Strip was placed approximating the incision. Hemostasis was noted.  Nexplanon Insertion  Patient given informed consent, signed copy in the chart, time out was performed.  Appropriate time out taken.  [name] LEFT arm was prepped and draped in the usual sterile  fashion. The ruler used to measure and mark insertion area.  Pt was prepped with betadine swab and then injected with 1.0cc of 2% lidocaine with epinephrine. Nexplanon removed form packaging,  Device confirmed  in needle, then inserted full length of needle and withdrawn per handbook instructions.  Pt insertion site covered with steri-strip and a bandage.   Minimal blood loss.  Pt tolerated the procedure welL.  Assessment/Plan: UTI symptoms - Plan: POCT Urinalysis Dipstick, Urine Culture; pos sx, neg UA. RT back pain is more MSK on exam. Check C&S. Will f/u if pos. D/C caffeine, increase water.  Encounter for removal and reinsertion of Nexplanon - Plan: etonogestrel (NEXPLANON) 68 MG IMPL implant--She was told to remove the dressing in 12-24 hours, to keep the incision area dry for 24 hours and to remove the Steristrip in 2-3  days. Notify us if any signs of tenderness, redness, pain, or fevers develop.    Meds ordered this encounter  Medications   etonogestrel (NEXPLANON) 68 MG IMPL implant    Sig: 1 each (68 mg total) by Subdermal route once for 1 dose.    Dispense:  1 each    Refill:  0    Order Specific Question:   Supervising Provider    Answer:   Nadara Mustard [[id number]]      Return in about 3 months (around 04/08/2021) for annual.  Elih Mooney B. Shenicka Sunderlin, PA-C 01/06/2021 9:20 AM

## 2021-01-06 ENCOUNTER — Ambulatory Visit: Payer: Managed Care, Other (non HMO) | Admitting: Obstetrics and Gynecology

## 2021-01-06 ENCOUNTER — Encounter: Payer: Self-pay | Admitting: Obstetrics and Gynecology

## 2021-01-06 ENCOUNTER — Other Ambulatory Visit: Payer: Self-pay

## 2021-01-06 VITALS — BP 120/70 | Ht 62.0 in | Wt 151.0 lb

## 2021-01-06 DIAGNOSIS — R399 Unspecified symptoms and signs involving the genitourinary system: Secondary | ICD-10-CM | POA: Diagnosis not present

## 2021-01-06 DIAGNOSIS — Z3046 Encounter for surveillance of implantable subdermal contraceptive: Secondary | ICD-10-CM | POA: Diagnosis not present

## 2021-01-06 LAB — POCT URINALYSIS DIPSTICK
Bilirubin, UA: NEGATIVE
Glucose, UA: NEGATIVE
Ketones, UA: NEGATIVE
Leukocytes, UA: NEGATIVE
Nitrite, UA: NEGATIVE
Protein, UA: NEGATIVE
Spec Grav, UA: 1.025 (ref 1.010–1.025)
pH, UA: 6 (ref 5.0–8.0)

## 2021-01-06 MED ORDER — NEXPLANON 68 MG ~~LOC~~ IMPL: 1.0000 | DRUG_IMPLANT | Freq: Once | SUBCUTANEOUS | 0 refills | Status: DC

## 2021-01-06 NOTE — Patient Instructions (Signed)
I value your feedback and you entrusting us with your care. If you get a [facility] patient survey, I would appreciate you taking the time to let us know about your experience today. Thank you!  Remove the dressing in 24 hours,  keep the incision area dry for 24 hours and remove the Steristrip in 2-3  days.  Notify us if any signs of tenderness, redness, pain, or fevers develop.   

## 2021-01-08 LAB — URINE CULTURE: Organism ID, Bacteria: NO GROWTH

## 2021-03-30 NOTE — Progress Notes (Signed)
PCP:  Patient, No Pcp Per (Inactive)   Chief Complaint  Patient presents with   Gynecologic Exam    No concerns     HPI:      [name] is a 40 y.o. G0P0000 whose LMP was No LMP recorded. Patient has had an implant., presents today for her annual examination.  Her menses are absent with nexplanon. Dysmenorrhea none. She does not have intermenstrual bleeding.  Sex activity: not sexually active. Nexplanon REplaced 01/06/21 Last Pap: 03/25/20 Results were: no abnormalities /neg HPV DNA Hx of STDs: LGSIL 2013; HSV taking valtrex prn, needs RF  There is no FH of breast cancer. There is no FH of ovarian cancer. The patient does not do self-breast exams. Has bilat implants; hasn't noticed any breast masses or tenderness  Tobacco use: The patient denies current or previous tobacco use. Alcohol use: none No drug use.  Exercise: mod active  She does get adequate calcium and Vitamin D in her diet.   Past Medical History:  Diagnosis Date   Bacterial vaginosis    recurrent; tx c metroget prn c good relief   Genital herpes    History of abnormal cervical Pap smear 04/24/2011   LGSIL, normal paps since 2013/2015   History of Papanicolaou smear of cervix 11/23/11; 06/22/12;11/24/13   -/-, ct/gc neg; neg; -/-, ct/gc neg   Human papilloma virus (HPV) type 9 vaccine administered    gardisil completed   Immunization, viral disease 2008   gardasil series completed   Migraine with aura    Seizures (HCC)     Past Surgical History:  Procedure Laterality Date   BREAST ENHANCEMENT SURGERY Bilateral 2010   nexplanon insertion  12/28/2014   replaced 01/05/2018    Family History  Problem Relation Age of Onset   Colon polyps Mother    Polycystic ovary syndrome Mother    Diverticulitis Mother    Diverticulosis Mother    Melanoma Brother 43       on back   Stroke Maternal Grandmother    Diverticulitis Maternal Grandmother    Heart attack Paternal Grandfather    Melanoma Paternal  Aunt    Diabetes Neg Hx     Social History   Socioeconomic History   Marital status: Single    Spouse name: Not on file   Number of children: 0   Years of education: 16   Highest education level: Not on file  Occupational History   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  Tobacco Use   Smoking status: Former    Types: Cigarettes    Quit date: 04/05/2017    Years since quitting: 3.9   Smokeless tobacco: Never   Tobacco comments:    1 pack per week  Vaping Use   Vaping Use: Never used  Substance and Sexual Activity   Alcohol use: Yes    Comment: rare   Drug use: No   Sexual activity: Not Currently    Partners: Male    Birth control/protection: Implant  Other Topics Concern   Not on file  Social History Narrative   Not on file   Social Determinants of Health   Financial Resource Strain: Not on file  Food Insecurity: Not on file  Transportation Needs: Not on file  Physical Activity: Not on file  Stress: Not on file  Social Connections: Not on file  Intimate Partner Violence: Not on file     Current Outpatient Medications:    aspirin-acetaminophen-caffeine (EXCEDRIN MIGRAINE) [id number]  MG tablet, Take by mouth., Disp: , Rfl:    Cholecalciferol 25 MCG (1000 UT) tablet, Take by mouth., Disp: , Rfl:    meloxicam (MOBIC) 15 MG tablet, Take 15 mg by mouth daily., Disp: , Rfl:    etonogestrel (NEXPLANON) 68 MG IMPL implant, 1 each (68 mg total) by Subdermal route once for 1 dose., Disp: 1 each, Rfl: 0   valACYclovir (VALTREX) 500 MG tablet, TAKE 1 TABLET BY MOUTH TWICE DAILY FOR 3 DAYS AS NEEDED FOR OUTBREAK, Disp: 30 tablet, Rfl: 1     ROS:  Review of Systems  Constitutional:  Negative for fatigue, fever and unexpected weight change.  Respiratory:  Negative for cough, shortness of breath and wheezing.   Cardiovascular:  Negative for chest pain, palpitations and leg swelling.  Gastrointestinal:  Negative for blood in stool, constipation, diarrhea, nausea and vomiting.   Endocrine: Negative for cold intolerance, heat intolerance and polyuria.  Genitourinary:  Negative for dyspareunia, dysuria, flank pain, frequency, genital sores, hematuria, menstrual problem, pelvic pain, urgency, vaginal bleeding, vaginal discharge and vaginal pain.  Musculoskeletal:  Negative for back pain, joint swelling and myalgias.  Skin:  Negative for rash.  Neurological:  Positive for headaches. Negative for dizziness, syncope, light-headedness and numbness.  Hematological:  Negative for adenopathy.  Psychiatric/Behavioral:  Negative for agitation, confusion, sleep disturbance and suicidal ideas. The patient is not nervous/anxious.  BREAST: No symptoms   Objective: BP 100/70   Ht 5\' 2"  (1.575 m)   Wt 145 lb (65.8 kg)   BMI 26.52 kg/m    Physical Exam Constitutional:      Appearance: She is well-developed.  Genitourinary:     Vulva normal.     Right Labia: No rash, tenderness or lesions.    Left Labia: No tenderness, lesions or rash.    No vaginal discharge, erythema or tenderness.      Right Adnexa: not tender and no mass present.    Left Adnexa: not tender and no mass present.    No cervical friability or polyp.     Uterus is not enlarged or tender.  Breasts:    Right: No mass, nipple discharge, skin change or tenderness.     Left: No mass, nipple discharge, skin change or tenderness.  Neck:     Thyroid: No thyromegaly.  Cardiovascular:     Rate and Rhythm: Normal rate and regular rhythm.     Heart sounds: Normal heart sounds. No murmur heard. Pulmonary:     Effort: Pulmonary effort is normal.     Breath sounds: Normal breath sounds.  Chest:    Abdominal:     Palpations: Abdomen is soft.     Tenderness: There is no abdominal tenderness. There is no guarding or rebound.  Musculoskeletal:        General: Normal range of motion.     Cervical back: Normal range of motion.  Lymphadenopathy:     Cervical: No cervical adenopathy.  Neurological:     General:  No focal deficit present.     Mental Status: She is alert and oriented to person, place, and time.     Cranial Nerves: No cranial nerve deficit.  Skin:    General: Skin is warm and dry.  Psychiatric:        Mood and Affect: Mood normal.        Behavior: Behavior normal.        Thought Content: Thought content normal.        Judgment: Judgment normal.  Vitals reviewed.    Assessment/Plan: Encounter for annual routine gynecological examination  Encounter for surveillance of implantable subdermal contraceptive; due for removal 6/25  Herpes simplex vulvovaginitis - Plan: valACYclovir (VALTREX) 500 MG tablet; Rx RF prn sx  Left breast mass - Plan: MM DIAG BREAST TOMO BILATERAL, US BREAST LTD UNI LEFT INC AXILLA, US BREAST LTD UNI RIGHT INC AXILLA; check dx mammo and u/s; pt to call [facility] to sched appt. Will f/u with results.   Encounter for screening mammogram for malignant neoplasm of breast - Plan: MM DIAG BREAST TOMO BILATERAL, US BREAST LTD UNI LEFT INC AXILLA, US BREAST LTD UNI RIGHT INC AXILLA  Need for immunization against influenza - Plan: Flu Vaccine QUAD 75mo+IM (Fluarix, Fluzone & Alfiuria Quad PF)  Meds ordered this encounter  Medications   valACYclovir (VALTREX) 500 MG tablet    Sig: TAKE 1 TABLET BY MOUTH TWICE DAILY FOR 3 DAYS AS NEEDED FOR OUTBREAK    Dispense:  30 tablet    Refill:  1    Order Specific Question:   Supervising Provider    Answer:   Nadara Mustard [[id number]]     GYN counsel adequate intake of calcium and vitamin D, diet and exercise     F/U  Return in about 1 year (around 03/31/2022).  Eyad Rochford B. Loni Abdon, PA-C 03/31/2021 9:00 AM

## 2021-03-31 ENCOUNTER — Other Ambulatory Visit: Payer: Self-pay

## 2021-03-31 ENCOUNTER — Encounter: Payer: Self-pay | Admitting: Obstetrics and Gynecology

## 2021-03-31 ENCOUNTER — Ambulatory Visit (INDEPENDENT_AMBULATORY_CARE_PROVIDER_SITE_OTHER): Payer: Managed Care, Other (non HMO) | Admitting: Obstetrics and Gynecology

## 2021-03-31 VITALS — BP 100/70 | Ht 62.0 in | Wt 145.0 lb

## 2021-03-31 DIAGNOSIS — A6004 Herpesviral vulvovaginitis: Secondary | ICD-10-CM | POA: Diagnosis not present

## 2021-03-31 DIAGNOSIS — N6320 Unspecified lump in the left breast, unspecified quadrant: Secondary | ICD-10-CM

## 2021-03-31 DIAGNOSIS — Z23 Encounter for immunization: Secondary | ICD-10-CM | POA: Diagnosis not present

## 2021-03-31 DIAGNOSIS — Z3046 Encounter for surveillance of implantable subdermal contraceptive: Secondary | ICD-10-CM

## 2021-03-31 DIAGNOSIS — Z1231 Encounter for screening mammogram for malignant neoplasm of breast: Secondary | ICD-10-CM

## 2021-03-31 DIAGNOSIS — Z01419 Encounter for gynecological examination (general) (routine) without abnormal findings: Secondary | ICD-10-CM

## 2021-03-31 MED ORDER — VALACYCLOVIR HCL 500 MG PO TABS: ORAL_TABLET | ORAL | 1 refills | Status: DC

## 2021-03-31 NOTE — Patient Instructions (Signed)
I value your feedback and you entrusting us with your care. If you get a [facility] patient survey, I would appreciate you taking the time to let us know about your experience today. Thank you! ? ? ?

## 2021-04-15 ENCOUNTER — Other Ambulatory Visit: Payer: Self-pay | Admitting: Obstetrics and Gynecology

## 2021-04-15 ENCOUNTER — Ambulatory Visit
Admission: RE | Admit: 2021-04-15 | Discharge: 2021-04-15 | Disposition: A | Discharge status: Home / Self Care | Payer: Managed Care, Other (non HMO) | Source: Ambulatory Visit | Attending: Obstetrics and Gynecology | Admitting: Obstetrics and Gynecology

## 2021-04-15 ENCOUNTER — Other Ambulatory Visit: Payer: Self-pay

## 2021-04-15 DIAGNOSIS — Z1231 Encounter for screening mammogram for malignant neoplasm of breast: Secondary | ICD-10-CM | POA: Insufficient documentation

## 2021-04-15 DIAGNOSIS — N6320 Unspecified lump in the left breast, unspecified quadrant: Secondary | ICD-10-CM | POA: Diagnosis present

## 2021-04-18 ENCOUNTER — Other Ambulatory Visit: Payer: 59

## 2021-06-17 NOTE — Telephone Encounter (Signed)
Nexplanon rcvd/charged 01/06/2021

## 2021-08-21 ENCOUNTER — Emergency Department
Admission: EM | Admit: 2021-08-21 | Discharge: 2021-08-21 | Disposition: A | Discharge status: Home / Self Care | Payer: No Typology Code available for payment source | Attending: Student in an Organized Health Care Education/Training Program | Admitting: Student in an Organized Health Care Education/Training Program

## 2021-08-21 ENCOUNTER — Other Ambulatory Visit: Payer: Self-pay

## 2021-08-21 DIAGNOSIS — Z7721 Contact with and (suspected) exposure to potentially hazardous body fluids: Secondary | ICD-10-CM | POA: Insufficient documentation

## 2021-08-21 LAB — COMPREHENSIVE METABOLIC PANEL
ALT: 17 U/L (ref 0–44)
AST: 19 U/L (ref 15–41)
Albumin: 4.5 g/dL (ref 3.5–5.0)
Alkaline Phosphatase: 74 U/L (ref 38–126)
Anion gap: 7 (ref 5–15)
BUN: 16 mg/dL (ref 6–20)
CO2: 26 mmol/L (ref 22–32)
Calcium: 9.5 mg/dL (ref 8.9–10.3)
Chloride: 105 mmol/L (ref 98–111)
Creatinine, Ser: 0.85 mg/dL (ref 0.44–1.00)
GFR, Estimated: >60 mL/min (ref >60)
Glucose, Bld: 98 mg/dL (ref 70–99)
Potassium: 3.6 mmol/L (ref 3.5–5.1)
Sodium: 138 mmol/L (ref 135–145)
Total Bilirubin: 0.5 mg/dL (ref 0.3–1.2)
Total Protein: 7.7 g/dL (ref 6.5–8.1)

## 2021-08-21 LAB — RAPID HIV SCREEN (HIV 1/2 AB+AG)
HIV 1/2 Antibodies: NONREACTIVE
HIV-1 P24 Antigen - HIV24: NONREACTIVE

## 2021-08-21 LAB — HEPATITIS C ANTIBODY: HCV Ab: NONREACTIVE

## 2021-08-21 LAB — POC URINE PREG, ED: Preg Test, Ur: NEGATIVE

## 2021-08-21 LAB — HEPATITIS B SURFACE ANTIGEN: Hepatitis B Surface Ag: NONREACTIVE

## 2021-08-21 MED ORDER — ELVITEG-COBIC-EMTRICIT-TENOFAF 150-150-200-10 MG PO TABS: 1.0000 | ORAL_TABLET | Freq: Every day | ORAL | 1 refills | Status: DC

## 2021-08-21 MED ORDER — ONDANSETRON 4 MG PO TBDP: 4.0000 mg | ORAL_TABLET | Freq: Three times a day (TID) | ORAL | 0 refills | Status: DC | PRN

## 2021-08-21 NOTE — ED Triage Notes (Signed)
Pt to ED for workers comp [employer], states had blood splashed in face from tube. Rinsed eyes out afterwards, had mask on. Happened at 1300  Pt has paperwork from work with her.

## 2021-08-21 NOTE — ED Provider Notes (Signed)
[facility] Provider Note    Event Date/Time   First MD Initiated Contact with Patient 08/21/21 1511     (approximate)   History   Exposure  HPI  Penny Hamilton is a 41 y.o. female no significant past medical history presents the ER from [employer] where she works after having 2 simple blood splashed up into her eye.  She did irrigate out her eyes was directed to the ER for evaluation.  She states she otherwise feels well at this time is interested in receiving postexposure prophylaxis.  No history of liver troubles no history of lung, cardiac or GI issues.     Physical Exam   Triage Vital Signs: ED Triage Vitals  Enc Vitals Group     BP 08/21/21 1423 124/87     Pulse Rate 08/21/21 1423 91     Resp 08/21/21 1423 18     Temp 08/21/21 1423 98.7 F (37.1 C)     Temp Source 08/21/21 1423 Oral     SpO2 08/21/21 1423 97 %     Weight 08/21/21 1425 145 lb (65.8 kg)     Height 08/21/21 1425 5\' 2"  (1.575 m)     Head Circumference --      Peak Flow --      Pain Score 08/21/21 1425 0     Pain Loc --      Pain Edu? --      Excl. in [place]? --     Most recent vital signs: Vitals:   08/21/21 1423  BP: 124/87  Pulse: 91  Resp: 18  Temp: 98.7 F (37.1 C)  SpO2: 97%     Constitutional: Alert  Eyes: Conjunctivae are normal.  Head: Atraumatic. Nose: No congestion/rhinnorhea. Mouth/Throat: Mucous membranes are moist.   Neck: Painless ROM.  Cardiovascular:   Good peripheral circulation. Respiratory: Normal respiratory effort.  No retractions.  Gastrointestinal: Soft and nontender.  Musculoskeletal:  no deformity Neurologic:  MAE spontaneously. No gross focal neurologic deficits are appreciated.  Skin:  Skin is warm, dry and intact. No rash noted. Psychiatric: Mood and affect are normal. Speech and behavior are normal.    ED Results / Procedures / Treatments   Labs (all labs ordered are listed, but only abnormal results are displayed) Labs Reviewed   RAPID HIV SCREEN (HIV 1/2 AB+AG)  COMPREHENSIVE METABOLIC PANEL  HEPATITIS C ANTIBODY  HEPATITIS B SURFACE ANTIGEN  RPR  POC URINE PREG, ED     EKG     RADIOLOGY    PROCEDURES:  Critical Care performed:   Procedures   MEDICATIONS ORDERED IN ED: Medications - No data to display   IMPRESSION / MDM / [place] / ED COURSE  I reviewed the triage vital signs and the nursing notes.                              Differential diagnosis includes, but is not limited to, blood-borne pathogen exposure, chemical conjunctivitis, chemical burn  Patient well-appearing in no acute distress.  Asymptomatic.  She appropriately irrigated eyes after the exposure.  She is otherwise well-appearing.  Discussed risks and benefits of postexposure prophylaxis.  Patient electing to proceed with postexposure prophylaxis which I have ordered.  Her LFTs are normal she is not pregnant.  Discussed outpatient follow-up.       FINAL CLINICAL IMPRESSION(S) / ED DIAGNOSES   Final diagnoses:  Exposure to blood or body  fluid     Rx / DC Orders   ED Discharge Orders          Ordered    elvitegravir-cobicistat-emtricitabine-tenofovir (GENVOYA) 150-150-200-10 MG TABS tablet  Daily with breakfast        08/21/21 1656    ondansetron (ZOFRAN-ODT) 4 MG disintegrating tablet  Every 8 hours PRN        08/21/21 1657             Note:  This document was prepared using Dragon voice recognition software and may include unintentional dictation errors.    Merlyn Lot, MD 08/21/21 [phone]

## 2021-08-21 NOTE — ED Notes (Signed)
Neg. Poct urine preg test.

## 2021-08-22 LAB — RPR: RPR Ser Ql: NONREACTIVE

## 2022-05-08 IMAGING — MG MM  DIGITAL DIAGNOSTIC BREAST BILAT IMPLANT W/ TOMO W/ CAD
8 of 14 series · 8 of 34 positions shown · non-contrast
Comparison: None.

CLINICAL DATA: 40-year-old female presenting for evaluation of a
palpable left breast mass identified on clinical breast exam.

EXAM:
DIGITAL DIAGNOSTIC BILATERAL MAMMOGRAM WITH IMPLANTS, CAD AND
TOMOSYNTHESIS; ULTRASOUND LEFT BREAST LIMITED
TECHNIQUE: Bilateral digital diagnostic mammography and breast tomosynthesis
was performed. The images were evaluated with computer-aided
detection. Standard and/or implant displaced views were performed.;
Targeted ultrasound examination of the left breast was performed.

[R CC]
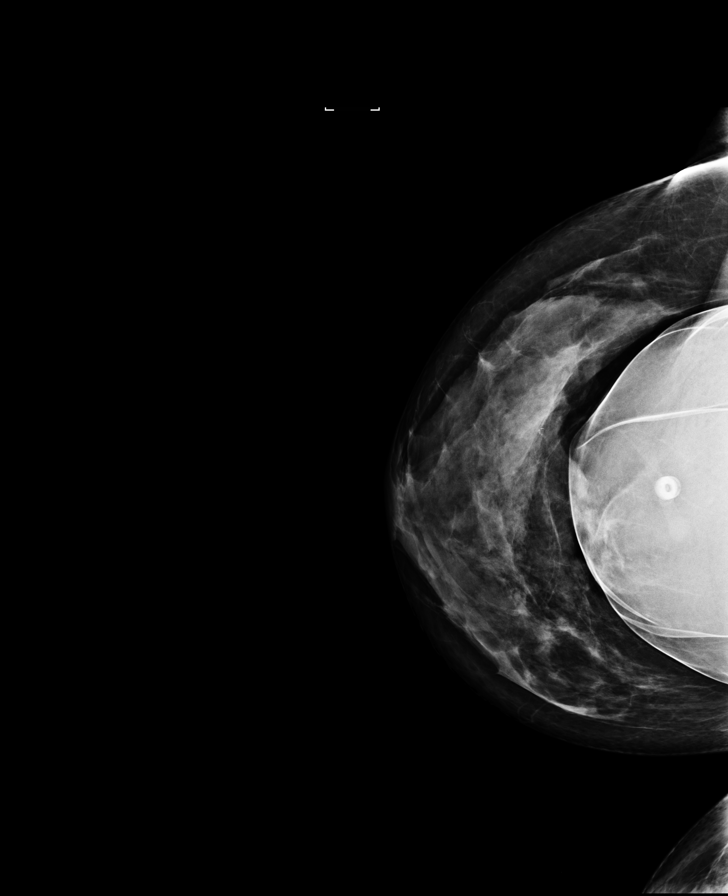

[R MLO]
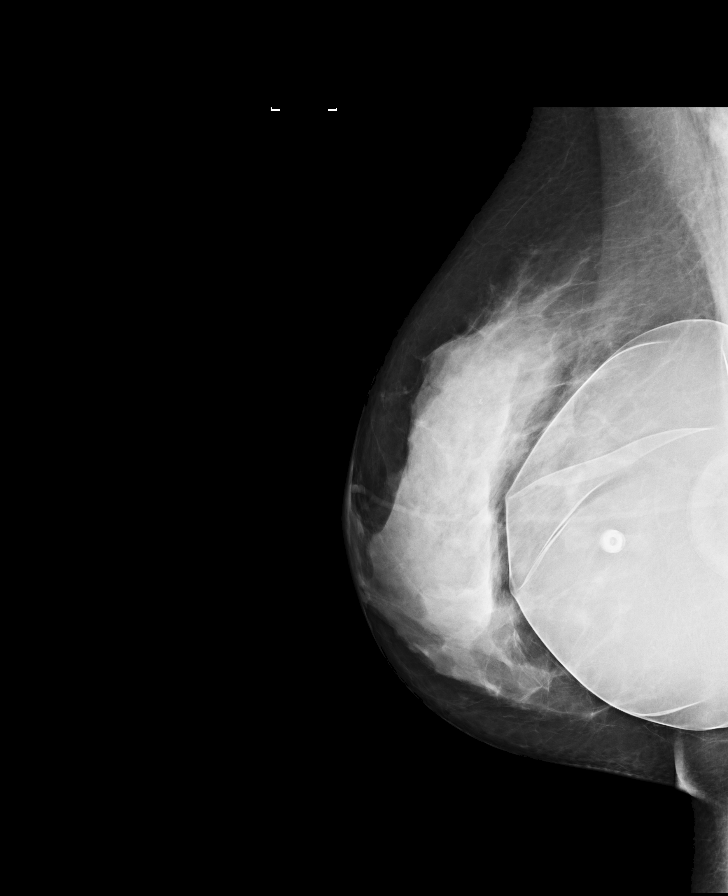

[L MLO]
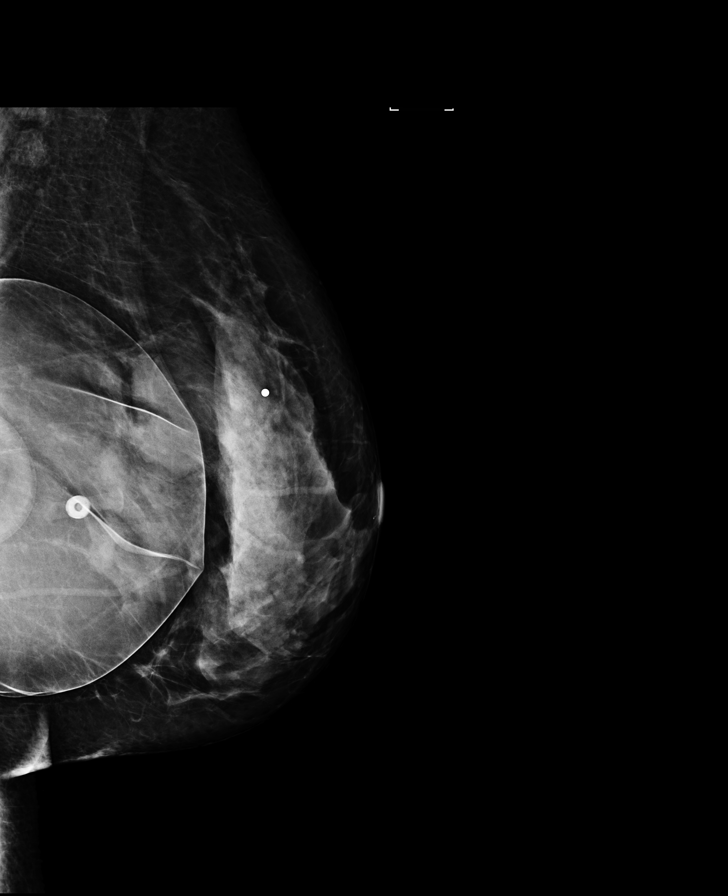

[L CC]
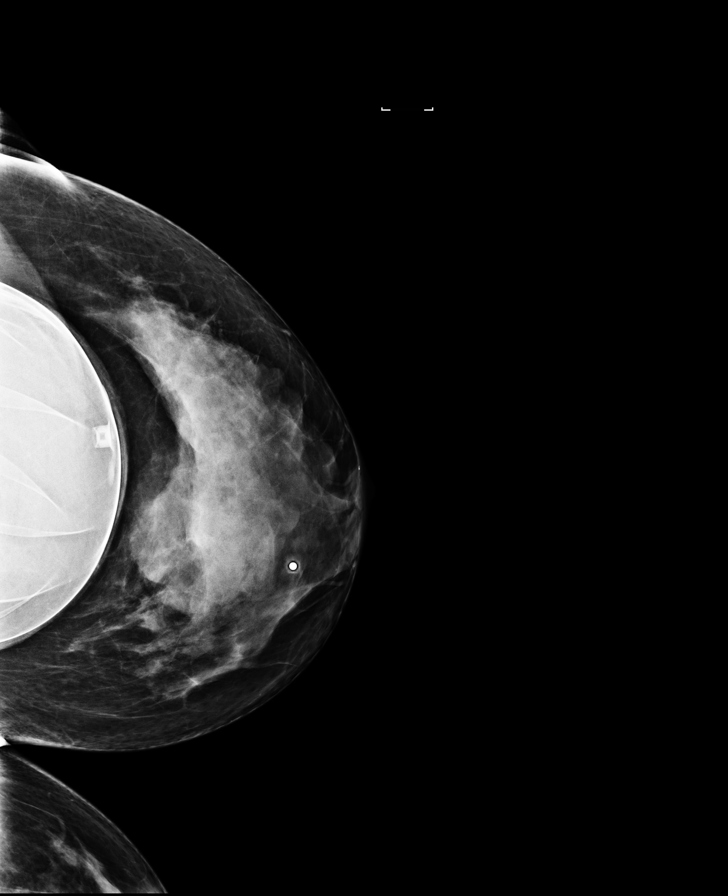

[R MLO synth-2D]
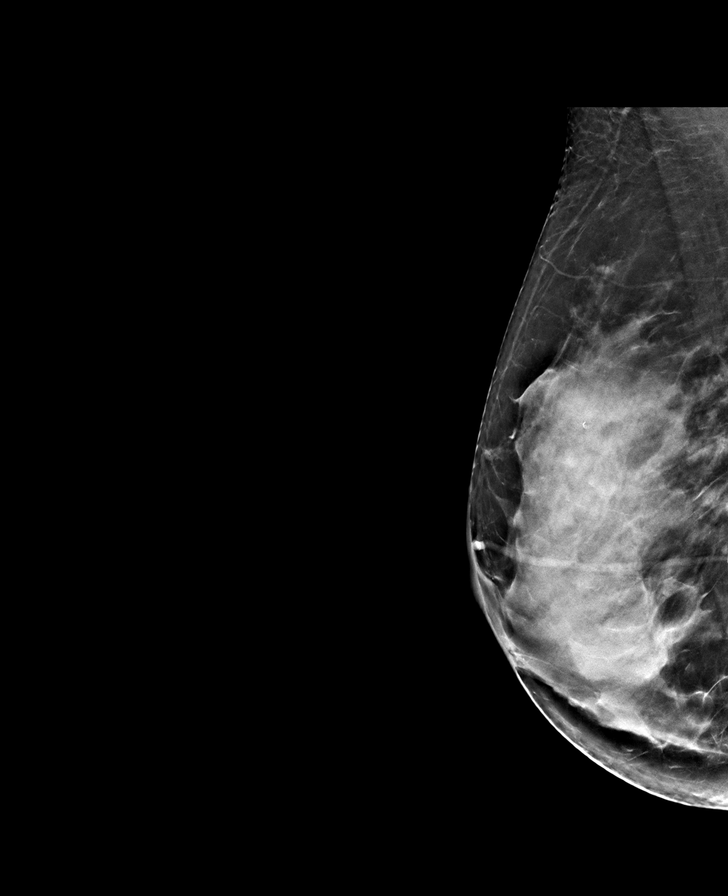

[L CC synth-2D]
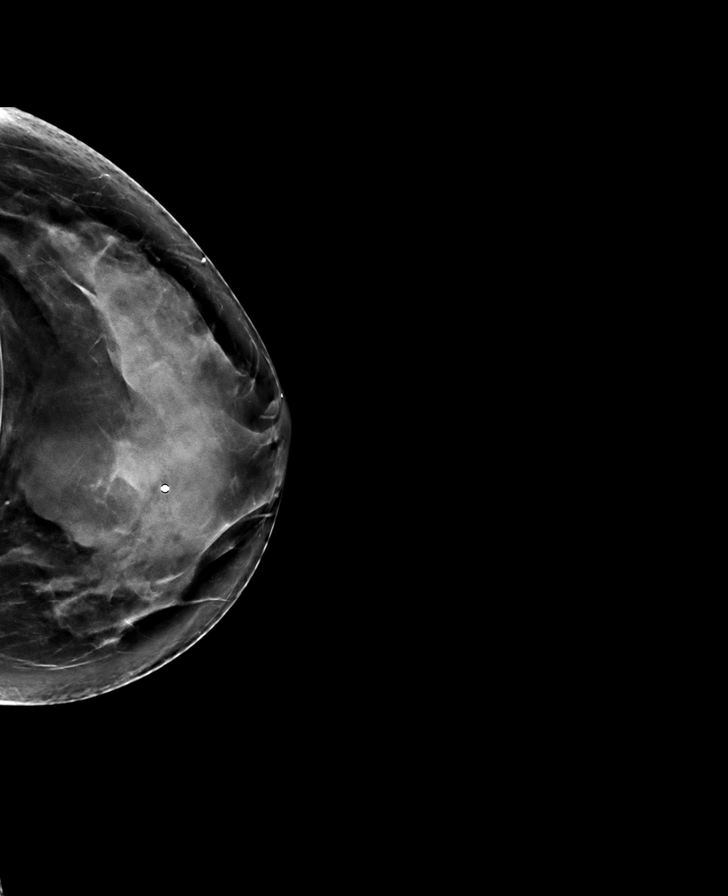

[R CC synth-2D]
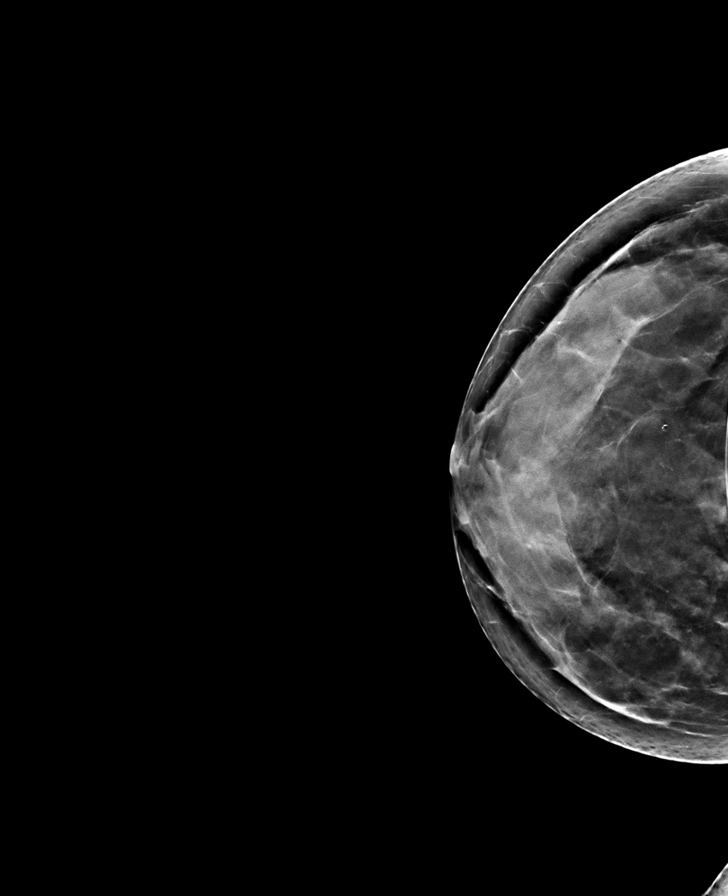

[L MLO synth-2D]
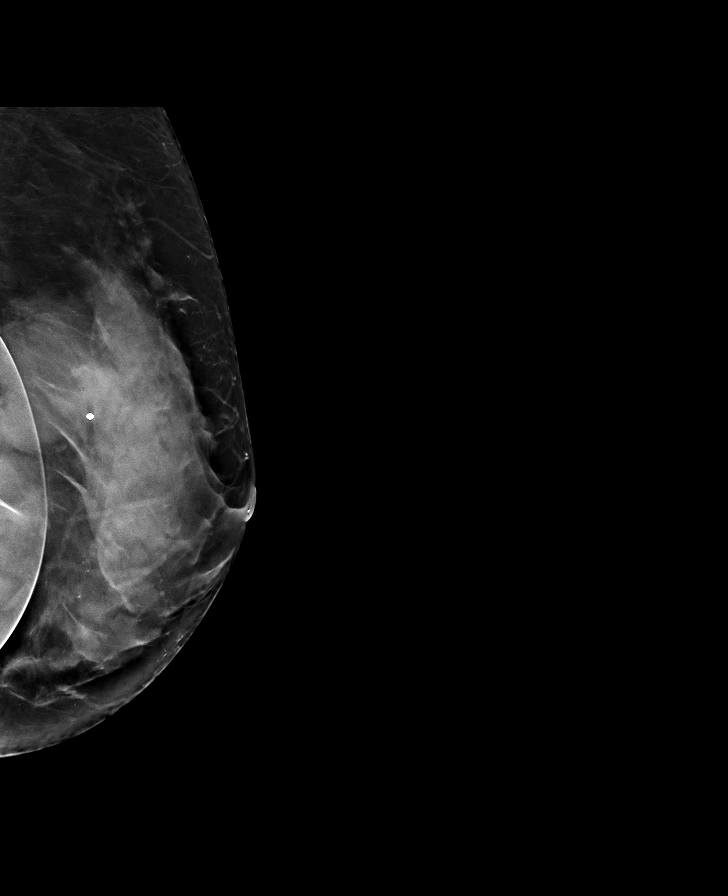

[8 of 34 positions shown; findings below may reference images not displayed]

ACR Breast Density Category d: The breast tissue is extremely dense,
which lowers the sensitivity of mammography.
FINDINGS: Spot compression tomosynthesis images over the palpable site of
concern in the superior left breast, which has been marked with a BB
on the skin surface, demonstrates an obscured oval mass measuring
approximately 4 cm. Just anterior to this mass is a smaller obscured
mass measuring approximately 1.2 cm. No other suspicious
calcifications, masses or areas of distortion are seen in the
bilateral breasts. The patient has retropectoral implants.

Ultrasound targeted to the palpable site in the left breast at 11
o'clock, 4 cm from the nipple demonstrates an anechoic oval
circumscribed mass measuring 3.4 x 2.5 x 3.1 cm. There are numerous
surrounding smaller cysts in clusters of cysts, which are benign.
IMPRESSION: 1. The palpable site in the superior left breast corresponds with a
benign cyst. Numerous other cysts are seen nearby on ultrasound.

2.  No mammographic evidence of malignancy in the bilateral breasts.

RECOMMENDATION:
1.  Screening mammogram in one year.(Code:78-H-OLQ)

I have discussed the findings and recommendations with the patient.
If applicable, a reminder letter will be sent to the patient
regarding the next appointment.

BI-RADS CATEGORY  2: Benign.

## 2022-06-03 ENCOUNTER — Encounter: Payer: Self-pay | Admitting: Obstetrics and Gynecology

## 2023-02-03 ENCOUNTER — Other Ambulatory Visit: Payer: Self-pay | Admitting: Obstetrics and Gynecology

## 2023-02-03 DIAGNOSIS — A6004 Herpesviral vulvovaginitis: Secondary | ICD-10-CM

## 2023-04-06 ENCOUNTER — Encounter: Payer: Self-pay | Admitting: Obstetrics and Gynecology

## 2023-04-06 ENCOUNTER — Ambulatory Visit (INDEPENDENT_AMBULATORY_CARE_PROVIDER_SITE_OTHER): Payer: Managed Care, Other (non HMO) | Admitting: Obstetrics and Gynecology

## 2023-04-06 VITALS — BP 110/68 | Ht 62.0 in | Wt 149.0 lb

## 2023-04-06 DIAGNOSIS — Z01419 Encounter for gynecological examination (general) (routine) without abnormal findings: Secondary | ICD-10-CM

## 2023-04-06 DIAGNOSIS — Z3046 Encounter for surveillance of implantable subdermal contraceptive: Secondary | ICD-10-CM

## 2023-04-06 DIAGNOSIS — Z1151 Encounter for screening for human papillomavirus (HPV): Secondary | ICD-10-CM

## 2023-04-06 DIAGNOSIS — Z124 Encounter for screening for malignant neoplasm of cervix: Secondary | ICD-10-CM

## 2023-04-06 DIAGNOSIS — A6004 Herpesviral vulvovaginitis: Secondary | ICD-10-CM

## 2023-04-06 DIAGNOSIS — Z1231 Encounter for screening mammogram for malignant neoplasm of breast: Secondary | ICD-10-CM

## 2023-04-06 MED ORDER — VALACYCLOVIR HCL 500 MG PO TABS: ORAL_TABLET | ORAL | 1 refills | Status: DC

## 2023-04-06 NOTE — Progress Notes (Signed)
PCP:  [facility]   Chief Complaint  Patient presents with   Gynecologic Exam    No concerns     HPI:      [name] is a 42 y.o. G0P0000 whose LMP was No LMP recorded. Patient has had an implant., presents today for her annual examination.  Her menses are absent with nexplanon. Dysmenorrhea none. She does not have intermenstrual bleeding.  Sex activity: not sexually active. Nexplanon REplaced 01/06/21. Hx of migraine with aura.  Last Pap: 03/25/20 Results were: no abnormalities /neg HPV DNA Hx of STDs: LGSIL 2013; HSV taking valtrex prn, needs RF  Last mammo: 04/15/21 for LT breast mass; Results were normal with LT breast cyst, repeat in 12 months There is no FH of breast cancer. There is no FH of ovarian cancer. The patient does not do self-breast exams. Has bilat implants.  Tobacco use: The patient denies current or previous tobacco use. Alcohol use: none No drug use.  Exercise: min active  She does not get adequate calcium or Vitamin D in her diet. Labs at work; borderline LDL but not fasting; possible pre-DM. No FH DM.    Past Medical History:  Diagnosis Date   Bacterial vaginosis    recurrent; tx c metroget prn c good relief   Genital herpes    History of abnormal cervical Pap smear 04/24/2011   LGSIL, normal paps since 2013/2015   History of Papanicolaou smear of cervix 11/23/11; 06/22/12;11/24/13   -/-, ct/gc neg; neg; -/-, ct/gc neg   Human papilloma virus (HPV) type 9 vaccine administered    gardisil completed   Immunization, viral disease 2008   gardasil series completed   Migraine with aura    Seizures (HCC)     Past Surgical History:  Procedure Laterality Date   BREAST ENHANCEMENT SURGERY Bilateral 2010   nexplanon insertion  12/28/2014   replaced 01/05/2018    Family History  Problem Relation Age of Onset   Colon polyps Mother    Polycystic ovary syndrome Mother    Diverticulitis Mother    Diverticulosis Mother    Melanoma  Paternal Aunt    Stroke Maternal Grandmother    Diverticulitis Maternal Grandmother    Heart attack Paternal Grandfather    Melanoma Brother 36       on back   Diabetes Neg Hx    Breast cancer Neg Hx     Social History   Socioeconomic History   Marital status: Single    Spouse name: Not on file   Number of children: 0   Years of education: 16   Highest education level: Not on file  Occupational History   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  Tobacco Use   Smoking status: Former    Current packs/day: 0.00    Types: Cigarettes    Quit date: 04/05/2017    Years since quitting: 6.0   Smokeless tobacco: Never   Tobacco comments:    1 pack per week  Vaping Use   Vaping status: Never Used  Substance and Sexual Activity   Alcohol use: Yes    Comment: rare   Drug use: No   Sexual activity: Not Currently    Partners: Male    Birth control/protection: Implant  Other Topics Concern   Not on file  Social History Narrative   Not on file   Social Determinants of Health   Financial Resource Strain: Not on file  Food Insecurity: Not on file  Transportation Needs: Not on file  Physical Activity: Unknown (03/07/2018)   Exercise Vital Sign    Days of Exercise per Week: 4 days    Minutes of Exercise per Session: Not on file  Stress: Not on file  Social Connections: Not on file  Intimate Partner Violence: Not on file     Current Outpatient Medications:    aspirin-acetaminophen-caffeine (EXCEDRIN MIGRAINE) 250-250-65 MG tablet, Take by mouth., Disp: , Rfl:    Cholecalciferol 25 MCG (1000 UT) tablet, Take by mouth. (Patient not taking: Reported on 04/06/2023), Disp: , Rfl:    etonogestrel (NEXPLANON) 68 MG IMPL implant, 1 each (68 mg total) by Subdermal route once for 1 dose., Disp: 1 each, Rfl: 0   valACYclovir (VALTREX) 500 MG tablet, TAKE 1 TABLET BY MOUTH TWICE DAILY FOR 3 DAYS AS NEEDED FOR OUTBREAK, Disp: 30 tablet, Rfl: 1     ROS:  Review of Systems  Constitutional:   Negative for fatigue, fever and unexpected weight change.  Respiratory:  Negative for cough, shortness of breath and wheezing.   Cardiovascular:  Negative for chest pain, palpitations and leg swelling.  Gastrointestinal:  Negative for blood in stool, constipation, diarrhea, nausea and vomiting.  Endocrine: Negative for cold intolerance, heat intolerance and polyuria.  Genitourinary:  Negative for dyspareunia, dysuria, flank pain, frequency, genital sores, hematuria, menstrual problem, pelvic pain, urgency, vaginal bleeding, vaginal discharge and vaginal pain.  Musculoskeletal:  Negative for back pain, joint swelling and myalgias.  Skin:  Negative for rash.  Neurological:  Negative for dizziness, syncope, light-headedness, numbness and headaches.  Hematological:  Negative for adenopathy.  Psychiatric/Behavioral:  Negative for agitation, confusion, sleep disturbance and suicidal ideas. The patient is not nervous/anxious.   BREAST: No symptoms   Objective: BP 110/68   Ht 5\' 2"  (1.575 m)   Wt 149 lb (67.6 kg)   BMI 27.25 kg/m    Physical Exam Constitutional:      Appearance: She is well-developed.  Genitourinary:     Vulva normal.     Right Labia: No rash, tenderness or lesions.    Left Labia: No tenderness, lesions or rash.    No vaginal discharge, erythema or tenderness.      Right Adnexa: not tender and no mass present.    Left Adnexa: not tender and no mass present.    No cervical friability or polyp.     Uterus is not enlarged or tender.  Breasts:    Right: No mass, nipple discharge, skin change or tenderness.     Left: No mass, nipple discharge, skin change or tenderness.  Neck:     Thyroid: No thyromegaly.  Cardiovascular:     Rate and Rhythm: Normal rate and regular rhythm.     Heart sounds: Normal heart sounds. No murmur heard. Pulmonary:     Effort: Pulmonary effort is normal.     Breath sounds: Normal breath sounds.  Abdominal:     Palpations: Abdomen is soft.      Tenderness: There is no abdominal tenderness. There is no guarding or rebound.  Musculoskeletal:        General: Normal range of motion.     Cervical back: Normal range of motion.  Lymphadenopathy:     Cervical: No cervical adenopathy.  Neurological:     General: No focal deficit present.     Mental Status: She is alert and oriented to person, place, and time.     Cranial Nerves: No cranial nerve deficit.  Skin:  General: Skin is warm and dry.  Psychiatric:        Mood and Affect: Mood normal.        Behavior: Behavior normal.        Thought Content: Thought content normal.        Judgment: Judgment normal.  Vitals reviewed.     Assessment/Plan: Encounter for annual routine gynecological examination  Cervical cancer screening - Plan: IGP, Aptima HPV  Screening for HPV (human papillomavirus) - Plan: IGP, Aptima HPV  Encounter for screening mammogram for malignant neoplasm of breast - Plan: MM 3D SCREENING MAMMOGRAM BILATERAL BREAST; pt to schedule mammo  Encounter for surveillance of implantable subdermal contraceptive--RTO 6/25 for removal (pt to check first in case has 4 yr indication by then)  Herpes simplex vulvovaginitis - Plan: valACYclovir (VALTREX) 500 MG tablet; Rx RF eRxd.    Meds ordered this encounter  Medications   valACYclovir (VALTREX) 500 MG tablet    Sig: TAKE 1 TABLET BY MOUTH TWICE DAILY FOR 3 DAYS AS NEEDED FOR OUTBREAK    Dispense:  30 tablet    Refill:  1    Order Specific Question:   Supervising Provider    Answer:   Waymon Budge     GYN counsel adequate intake of calcium and vitamin D, diet and exercise     F/U  Return in about 1 year (around 04/05/2024).  Saber Dickerman B. Miyoshi Ligas, PA-C 04/06/2023 10:59 AM

## 2023-04-06 NOTE — Patient Instructions (Addendum)
I value your feedback and you entrusting us with your care. If you get a [facility] patient survey, I would appreciate you taking the time to let us know about your experience today. Thank you!  [facility] ([place])--[phone]  

## 2023-04-09 LAB — IGP, APTIMA HPV: HPV Aptima: NEGATIVE

## 2023-07-08 ENCOUNTER — Ambulatory Visit
Admission: RE | Admit: 2023-07-08 | Discharge: 2023-07-08 | Disposition: A | Discharge status: Home / Self Care | Payer: Managed Care, Other (non HMO) | Source: Ambulatory Visit | Attending: Obstetrics and Gynecology | Admitting: Obstetrics and Gynecology

## 2023-07-08 DIAGNOSIS — Z1231 Encounter for screening mammogram for malignant neoplasm of breast: Secondary | ICD-10-CM | POA: Insufficient documentation

## 2024-01-08 NOTE — Progress Notes (Unsigned)
   No chief complaint on file.    HPI:  Penny Hamilton is a 43 y.o. G0P0000 here for Nexplanon  removal and insertion. Nexplanon  placed 01/06/21  There were no vitals taken for this visit.   Nexplanon  removal Procedure note - The Nexplanon  was noted in the patient's arm and the end was identified. The skin was cleansed with a Betadine solution. A small injection of subcutaneous lidocaine with epinephrine was given over the end of the implant. An incision was made at the end of the implant. The rod was noted in the incision and grasped with a hemostat. It was noted to be intact.  Steri-Strip was placed approximating the incision. Hemostasis was noted.  Nexplanon  Insertion  Patient given informed consent, signed copy in the chart, time out was performed. Pregnancy test was ***. Appropriate time out taken.  [name] LEFT/RIGHT *** arm was prepped and draped in the usual sterile fashion. The ruler used to measure and mark insertion area.  Pt was prepped with betadine swab and then injected with *** cc of 2% lidocaine with epinephrine. Nexplanon  removed form packaging,  Device confirmed in needle, then inserted full length of needle and withdrawn per handbook instructions.  Pt insertion site covered with steri-strip and a bandage.   Minimal blood loss.  Pt tolerated the procedure well.  Assessment: No diagnosis found.   No orders of the defined types were placed in this encounter.    Plan:   She was told to remove the dressing in 12-24 hours, to keep the incision area dry for 24 hours and to remove the Steristrip in 2-3  days.  Notify us  if any signs of tenderness, redness, pain, or fevers develop.   Nieko Clarin B. Alantra Popoca, PA-C 01/08/2024 10:00 AM

## 2024-01-10 ENCOUNTER — Encounter: Payer: Self-pay | Admitting: Obstetrics and Gynecology

## 2024-01-10 ENCOUNTER — Ambulatory Visit: Admitting: Obstetrics and Gynecology

## 2024-01-10 VITALS — BP 126/84 | HR 91 | Ht 62.0 in | Wt 149.0 lb

## 2024-01-10 DIAGNOSIS — Z3046 Encounter for surveillance of implantable subdermal contraceptive: Secondary | ICD-10-CM

## 2024-01-10 MED ORDER — ETONOGESTREL 68 MG ~~LOC~~ IMPL
68.0000 mg | DRUG_IMPLANT | Freq: Once | SUBCUTANEOUS | Status: AC
  Administered 2024-01-10: 68 mg via SUBCUTANEOUS

## 2024-01-10 NOTE — Patient Instructions (Signed)
I value your feedback and you entrusting [place] with your care. If you get a [facility] patient survey, I would appreciate you taking the time to let us know about your experience today. Thank you!  Remove the dressing in 24 hours,  keep the incision area dry for 24 hours and remove the Steristrip in 2-3  days.  Notify us if any signs of tenderness, redness, pain, or fevers develop.

## 2024-01-28 ENCOUNTER — Other Ambulatory Visit: Payer: Self-pay | Admitting: Obstetrics and Gynecology

## 2024-01-28 DIAGNOSIS — A6004 Herpesviral vulvovaginitis: Secondary | ICD-10-CM

## 2024-04-23 NOTE — Progress Notes (Unsigned)
 PCP:  [facility]   No chief complaint on file.    HPI:      [name] is a 43 y.o. G0P0000 whose LMP was No LMP recorded. Patient has had an implant., presents today for her annual examination.  Her menses are absent with nexplanon . Dysmenorrhea none. She does not have intermenstrual bleeding.  Sex activity: not sexually active. Nexplanon  REplaced 01/10/24. Hx of migraine with aura.  Last Pap: 04/06/23 Results were: no abnormalities /neg HPV DNA Hx of STDs: LGSIL 2013; HSV taking valtrex  prn, needs RF  Last mammo: 07/08/23  Results were normal with hx of LT breast cyst, repeat in 12 months There is no FH of breast cancer. There is no FH of ovarian cancer. The patient does not do self-breast exams. Has bilat implants.  Tobacco use: The patient denies current or previous tobacco use. Alcohol use: none No drug use.  Exercise: min active  She does not get adequate calcium or Vitamin D in her diet. Labs at work; borderline LDL but not fasting; possible pre-DM. No FH DM.    Past Medical History:  Diagnosis Date   Bacterial vaginosis    recurrent; tx c metroget prn c good relief   Genital herpes    History of abnormal cervical Pap smear 04/24/2011   LGSIL, normal paps since 2013/2015   History of Papanicolaou smear of cervix 11/23/11; 06/22/12;11/24/13   -/-, ct/gc neg; neg; -/-, ct/gc neg   Human papilloma virus (HPV) type 9 vaccine administered    gardisil completed   Immunization, viral disease 2008   gardasil series completed   Migraine with aura    Seizures (HCC)     Past Surgical History:  Procedure Laterality Date   BREAST ENHANCEMENT SURGERY Bilateral 2010   nexplanon  insertion  12/28/2014   replaced 01/05/2018    Family History  Problem Relation Age of Onset   Colon polyps Mother    Polycystic ovary syndrome Mother    Diverticulitis Mother    Diverticulosis Mother    Melanoma Paternal Aunt    Stroke Maternal Grandmother     Diverticulitis Maternal Grandmother    Heart attack Paternal Grandfather    Melanoma Brother 36       on back   Diabetes Neg Hx    Breast cancer Neg Hx     Social History   Socioeconomic History   Marital status: Single    Spouse name: Not on file   Number of children: 0   Years of education: 16   Highest education level: Not on file  Occupational History   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  Tobacco Use   Smoking status: Former    Current packs/day: 0.00    Types: Cigarettes    Quit date: 04/05/2017    Years since quitting: 7.0   Smokeless tobacco: Never   Tobacco comments:    1 pack per week  Vaping Use   Vaping status: Never Used  Substance and Sexual Activity   Alcohol use: Yes    Comment: rare   Drug use: No   Sexual activity: Not Currently    Partners: Male    Birth control/protection: Implant  Other Topics Concern   Not on file  Social History Narrative   Not on file   Social Drivers of Health   Financial Resource Strain: Not on file  Food Insecurity: Not on file  Transportation Needs: Not on file  Physical Activity: Unknown (03/07/2018)  Exercise Vital Sign    Days of Exercise per Week: 4 days    Minutes of Exercise per Session: Not on file  Stress: Not on file  Social Connections: Not on file  Intimate Partner Violence: Not on file     Current Outpatient Medications:    aspirin-acetaminophen-caffeine (EXCEDRIN MIGRAINE) 250-250-65 MG tablet, Take by mouth., Disp: , Rfl:    Cholecalciferol 25 MCG (1000 UT) tablet, Take by mouth., Disp: , Rfl:    etonogestrel  (NEXPLANON ) 68 MG IMPL implant, 1 each by Subdermal route once., Disp: , Rfl:    valACYclovir  (VALTREX ) 500 MG tablet, TAKE 1 TABLET BY MOUTH TWICE DAILY FOR 3 DAYS AS NEEDED FOR OUTBREAK, Disp: 30 tablet, Rfl: 0     ROS:  Review of Systems  Constitutional:  Negative for fatigue, fever and unexpected weight change.  Respiratory:  Negative for cough, shortness of breath and wheezing.    Cardiovascular:  Negative for chest pain, palpitations and leg swelling.  Gastrointestinal:  Negative for blood in stool, constipation, diarrhea, nausea and vomiting.  Endocrine: Negative for cold intolerance, heat intolerance and polyuria.  Genitourinary:  Negative for dyspareunia, dysuria, flank pain, frequency, genital sores, hematuria, menstrual problem, pelvic pain, urgency, vaginal bleeding, vaginal discharge and vaginal pain.  Musculoskeletal:  Negative for back pain, joint swelling and myalgias.  Skin:  Negative for rash.  Neurological:  Negative for dizziness, syncope, light-headedness, numbness and headaches.  Hematological:  Negative for adenopathy.  Psychiatric/Behavioral:  Negative for agitation, confusion, sleep disturbance and suicidal ideas. The patient is not nervous/anxious.   BREAST: No symptoms   Objective: There were no vitals taken for this visit.   Physical Exam Constitutional:      Appearance: She is well-developed.  Genitourinary:     Vulva normal.     Right Labia: No rash, tenderness or lesions.    Left Labia: No tenderness, lesions or rash.    No vaginal discharge, erythema or tenderness.      Right Adnexa: not tender and no mass present.    Left Adnexa: not tender and no mass present.    No cervical friability or polyp.     Uterus is not enlarged or tender.  Breasts:    Right: No mass, nipple discharge, skin change or tenderness.     Left: No mass, nipple discharge, skin change or tenderness.  Neck:     Thyroid: No thyromegaly.  Cardiovascular:     Rate and Rhythm: Normal rate and regular rhythm.     Heart sounds: Normal heart sounds. No murmur heard. Pulmonary:     Effort: Pulmonary effort is normal.     Breath sounds: Normal breath sounds.  Abdominal:     Palpations: Abdomen is soft.     Tenderness: There is no abdominal tenderness. There is no guarding or rebound.  Musculoskeletal:        General: Normal range of motion.     Cervical back:  Normal range of motion.  Lymphadenopathy:     Cervical: No cervical adenopathy.  Neurological:     General: No focal deficit present.     Mental Status: She is alert and oriented to person, place, and time.     Cranial Nerves: No cranial nerve deficit.  Skin:    General: Skin is warm and dry.  Psychiatric:        Mood and Affect: Mood normal.        Behavior: Behavior normal.        Thought Content:  Thought content normal.        Judgment: Judgment normal.  Vitals reviewed.     Assessment/Plan: Encounter for annual routine gynecological examination  Cervical cancer screening - Plan: IGP, Aptima HPV  Screening for HPV (human papillomavirus) - Plan: IGP, Aptima HPV  Encounter for screening mammogram for malignant neoplasm of breast - Plan: MM 3D SCREENING MAMMOGRAM BILATERAL BREAST; pt to schedule mammo  Encounter for surveillance of implantable subdermal contraceptive--RTO 6/25 for removal (pt to check first in case has 4 yr indication by then)  Herpes simplex vulvovaginitis - Plan: valACYclovir  (VALTREX ) 500 MG tablet; Rx RF eRxd.    No orders of the defined types were placed in this encounter.    GYN counsel adequate intake of calcium and vitamin D, diet and exercise     F/U  No follow-ups on file.  Johnnay Pleitez B. Reichen Hutzler, PA-C 04/23/2024 6:50 PM

## 2024-04-24 ENCOUNTER — Ambulatory Visit (INDEPENDENT_AMBULATORY_CARE_PROVIDER_SITE_OTHER): Admitting: Obstetrics and Gynecology

## 2024-04-24 ENCOUNTER — Encounter: Payer: Self-pay | Admitting: Obstetrics and Gynecology

## 2024-04-24 VITALS — BP 126/82 | HR 93 | Ht 62.0 in | Wt 150.0 lb

## 2024-04-24 DIAGNOSIS — Z1231 Encounter for screening mammogram for malignant neoplasm of breast: Secondary | ICD-10-CM

## 2024-04-24 DIAGNOSIS — Z01419 Encounter for gynecological examination (general) (routine) without abnormal findings: Secondary | ICD-10-CM

## 2024-04-24 DIAGNOSIS — Z3046 Encounter for surveillance of implantable subdermal contraceptive: Secondary | ICD-10-CM

## 2024-04-24 DIAGNOSIS — A6004 Herpesviral vulvovaginitis: Secondary | ICD-10-CM | POA: Diagnosis not present

## 2024-04-24 DIAGNOSIS — Z01411 Encounter for gynecological examination (general) (routine) with abnormal findings: Secondary | ICD-10-CM

## 2024-04-24 DIAGNOSIS — Z23 Encounter for immunization: Secondary | ICD-10-CM | POA: Diagnosis not present

## 2024-04-24 MED ORDER — VALACYCLOVIR HCL 500 MG PO TABS: ORAL_TABLET | ORAL | 1 refills | Status: AC

## 2024-04-24 NOTE — Patient Instructions (Addendum)
 I value your feedback and you entrusting [place] with your care. If you get a [facility] patient survey, I would appreciate you taking the time to let us know about your experience today. Thank you!  [facility] ([place])--[phone]

## 2024-07-11 ENCOUNTER — Ambulatory Visit
Admission: RE | Admit: 2024-07-11 | Discharge: 2024-07-11 | Disposition: A | Discharge status: Home / Self Care | Source: Ambulatory Visit | Attending: Obstetrics and Gynecology | Admitting: Obstetrics and Gynecology

## 2024-07-11 DIAGNOSIS — Z1231 Encounter for screening mammogram for malignant neoplasm of breast: Secondary | ICD-10-CM | POA: Insufficient documentation

## 2024-07-17 ENCOUNTER — Ambulatory Visit: Payer: Self-pay | Admitting: Obstetrics and Gynecology
